# Patient Record
Sex: Female | Born: 1975 | Race: White | Hispanic: No | Marital: Married | Smoking: Never smoker
Health system: Southern US, Community
[De-identification: ages and names within clinical notes are randomized; demographics above are authoritative.]

## PROBLEM LIST (undated history)

## (undated) DIAGNOSIS — K589 Irritable bowel syndrome without diarrhea: Secondary | ICD-10-CM

## (undated) DIAGNOSIS — R51 Headache: Secondary | ICD-10-CM

## (undated) DIAGNOSIS — Z9289 Personal history of other medical treatment: Secondary | ICD-10-CM

## (undated) DIAGNOSIS — Z87442 Personal history of urinary calculi: Secondary | ICD-10-CM

## (undated) DIAGNOSIS — M545 Low back pain, unspecified: Secondary | ICD-10-CM

## (undated) DIAGNOSIS — F329 Major depressive disorder, single episode, unspecified: Secondary | ICD-10-CM

## (undated) DIAGNOSIS — IMO0002 Reserved for concepts with insufficient information to code with codable children: Secondary | ICD-10-CM

## (undated) DIAGNOSIS — G8929 Other chronic pain: Secondary | ICD-10-CM

## (undated) DIAGNOSIS — R202 Paresthesia of skin: Secondary | ICD-10-CM

## (undated) DIAGNOSIS — F988 Other specified behavioral and emotional disorders with onset usually occurring in childhood and adolescence: Secondary | ICD-10-CM

## (undated) DIAGNOSIS — E282 Polycystic ovarian syndrome: Secondary | ICD-10-CM

## (undated) DIAGNOSIS — F319 Bipolar disorder, unspecified: Secondary | ICD-10-CM

## (undated) DIAGNOSIS — B977 Papillomavirus as the cause of diseases classified elsewhere: Secondary | ICD-10-CM

## (undated) DIAGNOSIS — M51369 Other intervertebral disc degeneration, lumbar region without mention of lumbar back pain or lower extremity pain: Secondary | ICD-10-CM

## (undated) DIAGNOSIS — K219 Gastro-esophageal reflux disease without esophagitis: Secondary | ICD-10-CM

## (undated) DIAGNOSIS — M5136 Other intervertebral disc degeneration, lumbar region: Secondary | ICD-10-CM

## (undated) DIAGNOSIS — F419 Anxiety disorder, unspecified: Secondary | ICD-10-CM

## (undated) DIAGNOSIS — R519 Headache, unspecified: Secondary | ICD-10-CM

## (undated) DIAGNOSIS — R112 Nausea with vomiting, unspecified: Secondary | ICD-10-CM

## (undated) DIAGNOSIS — F32A Depression, unspecified: Secondary | ICD-10-CM

## (undated) DIAGNOSIS — M4800 Spinal stenosis, site unspecified: Secondary | ICD-10-CM

## (undated) DIAGNOSIS — M1990 Unspecified osteoarthritis, unspecified site: Secondary | ICD-10-CM

## (undated) DIAGNOSIS — I499 Cardiac arrhythmia, unspecified: Secondary | ICD-10-CM

## (undated) DIAGNOSIS — R200 Anesthesia of skin: Secondary | ICD-10-CM

## (undated) DIAGNOSIS — M48061 Spinal stenosis, lumbar region without neurogenic claudication: Secondary | ICD-10-CM

## (undated) DIAGNOSIS — Z9889 Other specified postprocedural states: Secondary | ICD-10-CM

## (undated) HISTORY — PX: COLPOSCOPY: SHX161

## (undated) HISTORY — DX: Papillomavirus as the cause of diseases classified elsewhere: B97.7

## (undated) HISTORY — PX: GANGLION CYST EXCISION: SHX1691

## (undated) HISTORY — DX: Other intervertebral disc degeneration, lumbar region without mention of lumbar back pain or lower extremity pain: M51.369

## (undated) HISTORY — DX: Other intervertebral disc degeneration, lumbar region: M51.36

## (undated) HISTORY — DX: Reserved for concepts with insufficient information to code with codable children: IMO0002

## (undated) HISTORY — DX: Other specified behavioral and emotional disorders with onset usually occurring in childhood and adolescence: F98.8

## (undated) HISTORY — DX: Irritable bowel syndrome, unspecified: K58.9

## (undated) HISTORY — PX: BACK SURGERY: SHX140

## (undated) HISTORY — DX: Polycystic ovarian syndrome: E28.2

## (undated) HISTORY — DX: Spinal stenosis, site unspecified: M48.00

## (undated) HISTORY — PX: KNEE SURGERY: SHX244

---
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
## 1999-06-12 ENCOUNTER — Other Ambulatory Visit: Admission: RE | Admit: 1999-06-12 | Discharge: 1999-06-12 | Payer: Self-pay | Admitting: Gynecology

## 2000-03-03 ENCOUNTER — Inpatient Hospital Stay (HOSPITAL_COMMUNITY): Admission: AD | Admit: 2000-03-03 | Discharge: 2000-03-03 | Payer: Self-pay | Admitting: Gynecology

## 2000-03-11 ENCOUNTER — Inpatient Hospital Stay (HOSPITAL_COMMUNITY): Admission: AD | Admit: 2000-03-11 | Discharge: 2000-03-11 | Payer: Self-pay | Admitting: *Deleted

## 2000-03-15 ENCOUNTER — Inpatient Hospital Stay (HOSPITAL_COMMUNITY): Admission: AD | Admit: 2000-03-15 | Discharge: 2000-03-18 | Payer: Self-pay | Admitting: Gynecology

## 2001-05-01 ENCOUNTER — Other Ambulatory Visit: Admission: RE | Admit: 2001-05-01 | Discharge: 2001-05-01 | Payer: Self-pay | Admitting: Gynecology

## 2001-07-31 ENCOUNTER — Other Ambulatory Visit: Admission: RE | Admit: 2001-07-31 | Discharge: 2001-07-31 | Payer: Self-pay | Admitting: Obstetrics and Gynecology

## 2002-04-23 ENCOUNTER — Encounter: Payer: Self-pay | Admitting: Emergency Medicine

## 2002-04-23 ENCOUNTER — Emergency Department (HOSPITAL_COMMUNITY): Admission: EM | Admit: 2002-04-23 | Discharge: 2002-04-23 | Payer: Self-pay | Admitting: Emergency Medicine

## 2002-05-17 ENCOUNTER — Other Ambulatory Visit: Admission: RE | Admit: 2002-05-17 | Discharge: 2002-05-17 | Payer: Self-pay | Admitting: *Deleted

## 2003-05-29 ENCOUNTER — Other Ambulatory Visit: Admission: RE | Admit: 2003-05-29 | Discharge: 2003-05-29 | Payer: Self-pay | Admitting: Gynecology

## 2004-06-08 ENCOUNTER — Other Ambulatory Visit: Admission: RE | Admit: 2004-06-08 | Discharge: 2004-06-08 | Payer: Self-pay | Admitting: Gynecology

## 2005-06-23 ENCOUNTER — Emergency Department (HOSPITAL_COMMUNITY): Admission: EM | Admit: 2005-06-23 | Discharge: 2005-06-23 | Payer: Self-pay | Admitting: Family Medicine

## 2005-08-31 ENCOUNTER — Other Ambulatory Visit: Admission: RE | Admit: 2005-08-31 | Discharge: 2005-08-31 | Payer: Self-pay | Admitting: Gynecology

## 2006-12-14 ENCOUNTER — Other Ambulatory Visit: Admission: RE | Admit: 2006-12-14 | Discharge: 2006-12-14 | Payer: Self-pay | Admitting: Gynecology

## 2007-12-18 ENCOUNTER — Other Ambulatory Visit: Admission: RE | Admit: 2007-12-18 | Discharge: 2007-12-18 | Payer: Self-pay | Admitting: Obstetrics and Gynecology

## 2009-03-31 HISTORY — PX: CARPAL TUNNEL RELEASE: SHX101

## 2009-04-23 ENCOUNTER — Ambulatory Visit: Payer: Self-pay | Admitting: Gynecology

## 2009-04-23 ENCOUNTER — Other Ambulatory Visit: Admission: RE | Admit: 2009-04-23 | Discharge: 2009-04-23 | Payer: Self-pay | Admitting: Gynecology

## 2009-11-15 ENCOUNTER — Emergency Department (HOSPITAL_COMMUNITY): Admission: EM | Admit: 2009-11-15 | Discharge: 2009-11-15 | Payer: Self-pay | Admitting: Family Medicine

## 2010-04-29 ENCOUNTER — Ambulatory Visit: Payer: Self-pay | Admitting: Gynecology

## 2010-04-29 ENCOUNTER — Other Ambulatory Visit
Admission: RE | Admit: 2010-04-29 | Discharge: 2010-04-29 | Payer: Self-pay | Source: Home / Self Care | Admitting: Gynecology

## 2010-05-21 ENCOUNTER — Ambulatory Visit: Payer: Self-pay | Admitting: Gynecology
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
## 2010-10-16 NOTE — Discharge Summary (Signed)
[facility]  Patient:    Rachel French, Rachel French                      MRN: [id number] Adm. Date:  [id number] Disc. Date: [id number] Attending:  Tonye Royalty Dictator:   Antony Contras, [facility]                           Discharge Summary  DISCHARGE DIAGNOSES:          1. Intrauterine pregnancy at 39 weeks.                               2. History of positive group B streptococcus.  PROCEDURES:                   1. Normal spontaneous vaginal delivery over                                  intact perineum.                               2. Repair of first degree laceration.  HISTORY OF PRESENT ILLNESS:   The patient is a 35 year old, gravida 2, para 0-0-1-0 with an LMP of June 16, 1999, and [facility] of March 22, 2000. Prenatal risk factors include a history of oligohydramnios early in the pregnancy at 22 weeks, which was managed conservatively with rest and followed serially by ultrasounds, and this did resolve.  She also has a previous history of LEEP procedure.  PRENATAL LABORATORIES:        Blood type O+.  Antibody screen negative.  RPR, HBSAG, and HIV nonreactive.  MSAFP normal.  GBS positive.  HOSPITAL COURSE AND TREATMENT:                    The patient was admitted on March 15, 2000, with gross rupture of the membranes and no labor at that time.  On cervical exam, she was 1 cm, 80%, and -3 station.  She was admitted and given IV antibiotics to cover the group B streptococcus.  Also, Pitocin was initiated. She did progress to complete dilatation and delivered of an Apgar 8 and 29 female infant weighing 7 pounds 2 ounces over intact perineum.  A first degree laceration was repaired.  She also did have some lower uterine segment atony which was treated with massage, Pitocin, and IM Methergine.  During her postpartum course, she remained afebrile.  She had no difficulty voiding.  Her blood pressures were in the normal range.  She did have some  labile hypertension during the pregnancy.  The initial postpartum blood pressure was 158/90, but this did resolve.  She was able to be discharged on her second postpartum day in satisfactory condition.  The CBC showed a hematocrit of 27.8, hemoglobin 9.4, WBC 27.9, and platelets 227.  DISCHARGE MEDICATIONS:        She is to continue on her prenatal vitamins and iron.  Motrin and Tylox for pain.  FOLLOW-UP:                    Follow up in six weeks. DD:  04/08/00 TD:  04/08/00 Job: [id number]

## 2011-01-07 ENCOUNTER — Telehealth: Payer: Self-pay | Admitting: *Deleted

## 2011-01-07 NOTE — Telephone Encounter (Signed)
PT CALLED C/O EXTRA HAIR GROWTH & HORMONES OUT OF WACK, DECREASE SEX DRIVE. PT HAS PCOS AND SHE STATES THAT IT HAS GOTTEN WORSE WITH  BCP JUNEL .PT STATES THAT SHE HAS TO WAX ALL THE TIME AND HAS GROWN FACIAL HAIR. OFFICE VISIT OFFERED TO PT, SHE STATES SHE WONT HAVE MONEY UNTIL September ONCE SCHOOL STARTS BACK. SHE STATES THERE IS A PILL TO HELP WITH EXCESSIVE HAIR GROWTH THAT DR Eda Paschal TOLD HER ABOUT? PLEASE ADVISE.

## 2011-01-07 NOTE — Telephone Encounter (Signed)
[name] or [name] or the pills that have drospirenone a different type of progesterone that may have some benefit as far as patient's with PCOS. Also it is the pills that is on the television commercials talking about the increased risk of stroke heart attack DVT. He appears to have a 2-3 times higher risk over conventional pills. Having said that the risk is still low and otherwise healthy patient does not smoke cigarettes and is not being followed for medical issues such as hypertension and diabetes. Patient was on the as before she wants to restart this on to that as long as she understands the above and accepts the risks. Otherwise from a hair growth standpoint she may want to consider seeing a dermatologist to discuss options for medical treatment.

## 2011-01-08 ENCOUNTER — Telehealth: Payer: Self-pay | Admitting: Gynecology

## 2011-01-08 DIAGNOSIS — Z309 Encounter for contraceptive management, unspecified: Secondary | ICD-10-CM

## 2011-01-08 DIAGNOSIS — L680 Hirsutism: Secondary | ICD-10-CM

## 2011-01-08 MED ORDER — DROSPIRENONE-ETHINYL ESTRADIOL 3-0.02 MG PO TABS: 1.0000 | ORAL_TABLET | Freq: Every day | ORAL | Status: DC

## 2011-01-08 NOTE — Telephone Encounter (Signed)
PT INFORMED WITH THE BELOW NOTE. PT STATES SHE WILL SEE THE OUTCOME OF HER LAB RESULTS.

## 2011-01-08 NOTE — Telephone Encounter (Signed)
L/M FOR PT TO CALL.

## 2011-01-08 NOTE — Telephone Encounter (Signed)
Tell patient I want to check hair growth hormone levels just to make sure they are normal.  I ordered testosterone, DHEAS & 17 oh progesterone level.

## 2011-01-08 NOTE — Telephone Encounter (Signed)
Telephone call to reviewed medication request. Reviewed Rachel French has slight higher risk for blood clots and strokes. She states her skin has been terrible since she's been on the Loestrin 1/20. She saw dermatologist. Requests to return to the [name] will call in.

## 2011-01-08 NOTE — Telephone Encounter (Signed)
PT INFORMED WITH THE BELOW NOTE. PT WILL MAKE APPOINTMENT TO COME AND GET LABS DRAWN.

## 2011-01-08 NOTE — Telephone Encounter (Signed)
PT CALLING WANTING TO SWITCH BACK TO [name] BCP. PT STATES THAT HER NEW BCP LOESTRIN 1/20 CAUSES HER TO BREAK OUT. PLEASE ADVISE.

## 2011-01-14 ENCOUNTER — Other Ambulatory Visit: Payer: BC Managed Care – PPO | Admitting: *Deleted

## 2011-01-14 DIAGNOSIS — L680 Hirsutism: Secondary | ICD-10-CM

## 2011-01-15 LAB — TESTOSTERONE: Testosterone: 26.92 ng/dL (ref 10–70)

## 2011-06-10 ENCOUNTER — Encounter: Payer: Self-pay | Admitting: *Deleted

## 2011-06-10 DIAGNOSIS — O3440 Maternal care for other abnormalities of cervix, unspecified trimester: Secondary | ICD-10-CM | POA: Insufficient documentation

## 2011-06-10 DIAGNOSIS — F319 Bipolar disorder, unspecified: Secondary | ICD-10-CM | POA: Insufficient documentation

## 2011-06-10 DIAGNOSIS — Z9889 Other specified postprocedural states: Secondary | ICD-10-CM | POA: Insufficient documentation

## 2011-06-10 DIAGNOSIS — K5190 Ulcerative colitis, unspecified, without complications: Secondary | ICD-10-CM | POA: Insufficient documentation

## 2011-06-10 DIAGNOSIS — IMO0002 Reserved for concepts with insufficient information to code with codable children: Secondary | ICD-10-CM | POA: Insufficient documentation

## 2011-06-10 DIAGNOSIS — K589 Irritable bowel syndrome without diarrhea: Secondary | ICD-10-CM | POA: Insufficient documentation

## 2011-06-10 DIAGNOSIS — E282 Polycystic ovarian syndrome: Secondary | ICD-10-CM | POA: Insufficient documentation

## 2011-06-16 ENCOUNTER — Encounter: Payer: Self-pay | Admitting: Gynecology

## 2011-06-16 ENCOUNTER — Other Ambulatory Visit (HOSPITAL_COMMUNITY)
Admission: RE | Admit: 2011-06-16 | Discharge: 2011-06-16 | Disposition: A | Discharge status: Home / Self Care | Payer: BC Managed Care – PPO | Source: Ambulatory Visit | Attending: Gynecology | Admitting: Gynecology

## 2011-06-16 ENCOUNTER — Ambulatory Visit (INDEPENDENT_AMBULATORY_CARE_PROVIDER_SITE_OTHER): Payer: BC Managed Care – PPO | Admitting: Gynecology

## 2011-06-16 VITALS — BP 114/76 | Ht 62.0 in | Wt 169.0 lb

## 2011-06-16 DIAGNOSIS — R635 Abnormal weight gain: Secondary | ICD-10-CM

## 2011-06-16 DIAGNOSIS — Z309 Encounter for contraceptive management, unspecified: Secondary | ICD-10-CM

## 2011-06-16 DIAGNOSIS — R82998 Other abnormal findings in urine: Secondary | ICD-10-CM

## 2011-06-16 DIAGNOSIS — Z01419 Encounter for gynecological examination (general) (routine) without abnormal findings: Secondary | ICD-10-CM

## 2011-06-16 DIAGNOSIS — R8271 Bacteriuria: Secondary | ICD-10-CM

## 2011-06-16 DIAGNOSIS — Z131 Encounter for screening for diabetes mellitus: Secondary | ICD-10-CM

## 2011-06-16 DIAGNOSIS — Z13220 Encounter for screening for lipoid disorders: Secondary | ICD-10-CM

## 2011-06-16 LAB — URINALYSIS W MICROSCOPIC + REFLEX CULTURE
Bilirubin Urine: NEGATIVE
Leukocytes, UA: NEGATIVE
Protein, ur: NEGATIVE mg/dL
Urobilinogen, UA: 0.2 mg/dL (ref 0.0–1.0)

## 2011-06-16 LAB — LIPID PANEL
Total CHOL/HDL Ratio: 3 Ratio
VLDL: 28 mg/dL (ref 0–40)

## 2011-06-16 MED ORDER — DROSPIRENONE-ETHINYL ESTRADIOL 3-0.02 MG PO TABS: 1.0000 | ORAL_TABLET | Freq: Every day | ORAL | Status: DC

## 2011-06-16 NOTE — Patient Instructions (Addendum)
  Follow up for Pap smear results. If normal then repeat Pap smear in 6 months.

## 2011-06-16 NOTE — Progress Notes (Signed)
Rachel French December 09, 1975 [id number]        36 y.o.  for annual exam.  Overall doing well. She is having difficulty losing weight. She has a history of PCO. She had been on metformin in the past but can't tolerate it due to GI side effects with her ulcerative colitis. She's having regular periods on her oral contraceptives.  History of low-grade dysplasia one year ago did not follow up for her six-month Pap smear.  Past medical history,surgical history, medications, allergies, family history and social history were all reviewed and documented in the EPIC chart. ROS:  Was performed and pertinent positives and negatives are included in the history.  Exam: chaperone present Filed Vitals:   06/16/11 1514  BP: 114/76   General appearance  Normal Skin grossly normal Head/Neck normal with no cervical or supraclavicular adenopathy thyroid normal Lungs  clear Cardiac RR, without RMG Abdominal  soft, nontender, without masses, organomegaly or hernia Breasts  examined lying and sitting without masses, retractions, discharge or axillary adenopathy. Pelvic  Ext/BUS/vagina  normal   Cervix  normal  Pap done  Uterus  anteverted, normal size, shape and contour, midline and mobile nontender   Adnexa  Without masses or tenderness    Anus and perineum  normal   Rectovaginal  normal sphincter tone without palpated masses or tenderness.    Assessment/Plan:  36 y.o. female for annual exam.    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 Patient did not follow up for her 6 month Pap smears I recommended. Pap was done today. If Pap normal recommended follow up in 6 months we have several normals to avoid false negative results. The patient understands the importance of follow up. Otherwise then we'll triage based on results. 2. PCO. Patient's having difficulty losing weight. I reviewed exercise and diet issues and recommended several programs to include Weight Watchers. Do not think  medication at this point helpful particularly with metformin or other diabetic take medication as she has been known to have normal glucoses. I went ahead and checked a glucose and hemoglobin A1c today as well as a TSH. 3. Oral contraceptives. She is on [name] equivalent and wants to continue. I reviewed the risks benefits to include increased risk of stroke heart attack DVT. She has never smoked not being followed for any vascular issues accepts the risks and wants to continue and I refilled her times a year. 4. Breast health. SBE monthly review. She turns 36 this year and I discussed screening mammogram recommendation is to take 35 and 40. She has no strong family history and prefers to wait closer to 40. 5. Health maintenance.  Will check baseline CBC lipid profile along with her glucose hemoglobin A1c, TSH and urinalysis.    Dara Lords MD, 4:44 PM 06/16/2011

## 2011-06-17 ENCOUNTER — Other Ambulatory Visit: Payer: Self-pay | Admitting: *Deleted

## 2011-06-17 DIAGNOSIS — R8271 Bacteriuria: Secondary | ICD-10-CM

## 2011-06-17 LAB — CBC WITH DIFFERENTIAL/PLATELET
Basophils Relative: 0 % (ref 0–1)
Eosinophils Absolute: 0.8 10*3/uL — ABNORMAL HIGH (ref 0.0–0.7)
HCT: 39 % (ref 36.0–46.0)
Hemoglobin: 12.5 g/dL (ref 12.0–15.0)
MCH: 30 pg (ref 26.0–34.0)
MCHC: 32.1 g/dL (ref 30.0–36.0)
Monocytes Absolute: 1 10*3/uL (ref 0.1–1.0)
Monocytes Relative: 10 % (ref 3–12)

## 2011-06-17 LAB — HEMOGLOBIN A1C
Hgb A1c MFr Bld: 5.6 % (ref <5.7)
Mean Plasma Glucose: 114 mg/dL (ref <117)

## 2011-06-17 LAB — TSH: TSH: 1.454 u[IU]/mL (ref 0.350–4.500)

## 2011-06-17 NOTE — Progress Notes (Signed)
Addended by: Rushie Goltz on: 06/17/2011 10:25 AM   Modules accepted: Orders

## 2011-06-19 LAB — URINE CULTURE: Colony Count: 3000

## 2011-07-08 ENCOUNTER — Telehealth: Payer: Self-pay | Admitting: *Deleted

## 2011-07-08 NOTE — Telephone Encounter (Signed)
Pt informed of normal recent pap results. 

## 2011-12-21 ENCOUNTER — Other Ambulatory Visit (HOSPITAL_COMMUNITY)
Admission: RE | Admit: 2011-12-21 | Discharge: 2011-12-21 | Disposition: A | Discharge status: Home / Self Care | Payer: BC Managed Care – PPO | Source: Ambulatory Visit | Attending: Gynecology | Admitting: Gynecology

## 2011-12-21 ENCOUNTER — Ambulatory Visit (INDEPENDENT_AMBULATORY_CARE_PROVIDER_SITE_OTHER): Payer: BC Managed Care – PPO | Admitting: Gynecology

## 2011-12-21 ENCOUNTER — Encounter: Payer: Self-pay | Admitting: Gynecology

## 2011-12-21 DIAGNOSIS — IMO0002 Reserved for concepts with insufficient information to code with codable children: Secondary | ICD-10-CM

## 2011-12-21 DIAGNOSIS — R87810 Cervical high risk human papillomavirus (HPV) DNA test positive: Secondary | ICD-10-CM | POA: Insufficient documentation

## 2011-12-21 DIAGNOSIS — R87612 Low grade squamous intraepithelial lesion on cytologic smear of cervix (LGSIL): Secondary | ICD-10-CM

## 2011-12-21 DIAGNOSIS — Z01419 Encounter for gynecological examination (general) (routine) without abnormal findings: Secondary | ICD-10-CM | POA: Insufficient documentation

## 2011-12-21 NOTE — Patient Instructions (Signed)
Follow up for annual exam in 6 months. Office will contact you if there is a problem with your Pap smear.

## 2011-12-21 NOTE — Progress Notes (Signed)
Patient presents for Pap smear. She has a history of low-grade SIL  18 months ago.  Pap smear January 2013 was negative. Here for repack.  Exam with Selena Batten assistant Pelvic: External BUS vagina normal. Cervix normal Pap/HPV. Uterus normal size midline mobile retroverted. Adnexa without masses or tenderness.  Assessment and plan history of low-grade SIL. Pap/HPV done. Assuming normal then we'll follow up at her annual in 6 months.

## 2011-12-24 ENCOUNTER — Encounter: Payer: Self-pay | Admitting: Gynecology

## 2012-07-01 DIAGNOSIS — B977 Papillomavirus as the cause of diseases classified elsewhere: Secondary | ICD-10-CM

## 2012-07-01 DIAGNOSIS — IMO0002 Reserved for concepts with insufficient information to code with codable children: Secondary | ICD-10-CM

## 2012-07-01 HISTORY — DX: Reserved for concepts with insufficient information to code with codable children: IMO0002

## 2012-07-01 HISTORY — DX: Papillomavirus as the cause of diseases classified elsewhere: B97.7

## 2012-07-09 ENCOUNTER — Other Ambulatory Visit: Payer: Self-pay | Admitting: Women's Health

## 2012-07-10 ENCOUNTER — Other Ambulatory Visit: Payer: Self-pay

## 2012-07-10 MED ORDER — DROSPIRENONE-ETHINYL ESTRADIOL 3-0.02 MG PO TABS: ORAL_TABLET | ORAL | Status: DC

## 2012-07-10 NOTE — Telephone Encounter (Signed)
Appointment desk will call her to schedule CE.

## 2012-07-10 NOTE — Telephone Encounter (Signed)
Will be called to schedule her annual

## 2012-07-19 ENCOUNTER — Ambulatory Visit (INDEPENDENT_AMBULATORY_CARE_PROVIDER_SITE_OTHER): Payer: BC Managed Care – PPO | Admitting: Gynecology

## 2012-07-19 ENCOUNTER — Other Ambulatory Visit (HOSPITAL_COMMUNITY)
Admission: RE | Admit: 2012-07-19 | Discharge: 2012-07-19 | Disposition: A | Discharge status: Home / Self Care | Payer: BC Managed Care – PPO | Source: Ambulatory Visit | Attending: Gynecology | Admitting: Gynecology

## 2012-07-19 ENCOUNTER — Encounter: Payer: Self-pay | Admitting: Gynecology

## 2012-07-19 VITALS — BP 124/76 | Ht 62.0 in | Wt 171.0 lb

## 2012-07-19 DIAGNOSIS — R87810 Cervical high risk human papillomavirus (HPV) DNA test positive: Secondary | ICD-10-CM | POA: Insufficient documentation

## 2012-07-19 DIAGNOSIS — Z01419 Encounter for gynecological examination (general) (routine) without abnormal findings: Secondary | ICD-10-CM

## 2012-07-19 DIAGNOSIS — Z1151 Encounter for screening for human papillomavirus (HPV): Secondary | ICD-10-CM | POA: Insufficient documentation

## 2012-07-19 LAB — CBC WITH DIFFERENTIAL/PLATELET
Basophils Relative: 0 % (ref 0–1)
Eosinophils Absolute: 0.9 10*3/uL — ABNORMAL HIGH (ref 0.0–0.7)
Eosinophils Relative: 8 % — ABNORMAL HIGH (ref 0–5)
HCT: 35.8 % — ABNORMAL LOW (ref 36.0–46.0)
Hemoglobin: 12.1 g/dL (ref 12.0–15.0)
MCH: 30.7 pg (ref 26.0–34.0)
MCHC: 33.8 g/dL (ref 30.0–36.0)
Monocytes Absolute: 1.4 10*3/uL — ABNORMAL HIGH (ref 0.1–1.0)
Monocytes Relative: 12 % (ref 3–12)

## 2012-07-19 LAB — LIPID PANEL
LDL Cholesterol: 96 mg/dL (ref 0–99)
VLDL: 25 mg/dL (ref 0–40)

## 2012-07-19 LAB — COMPREHENSIVE METABOLIC PANEL
ALT: 9 U/L (ref 0–35)
AST: 10 U/L (ref 0–37)
Alkaline Phosphatase: 79 U/L (ref 39–117)
BUN: 15 mg/dL (ref 6–23)
Creat: 0.73 mg/dL (ref 0.50–1.10)
Potassium: 4.2 mEq/L (ref 3.5–5.3)

## 2012-07-19 MED ORDER — DROSPIRENONE-ETHINYL ESTRADIOL 3-0.02 MG PO TABS: ORAL_TABLET | ORAL | Status: DC

## 2012-07-19 MED ORDER — ACYCLOVIR 5 % EX OINT: TOPICAL_OINTMENT | CUTANEOUS | Status: DC

## 2012-07-19 NOTE — Patient Instructions (Addendum)
Follow up one year for annual exam. Follow up for lab results on my chart.

## 2012-07-19 NOTE — Progress Notes (Signed)
Rachel French 06/25/1975 [id number]        37 y.o.  G2P0011 for annual exam.  Several issues reviewed below.  Past medical history,surgical history, medications, allergies, family history and social history were all reviewed and documented in the EPIC chart. ROS:  Was performed and pertinent positives and negatives are included in the history.  Exam: Kim assistant Filed Vitals:   07/19/12 1458  BP: 124/76  Height: 5\' 2"  (1.575 m)  Weight: 171 lb (77.565 kg)   General appearance  Normal Skin grossly normal Head/Neck normal with no cervical or supraclavicular adenopathy thyroid normal Lungs  clear Cardiac RR, without RMG Abdominal  soft, nontender, without masses, organomegaly or hernia Breasts  examined lying and sitting without masses, retractions, discharge or axillary adenopathy. Pelvic  Ext/BUS/vagina  normal   Cervix  normal Pap/HPV  Uterus  anteverted, normal size, shape and contour, midline and mobile nontender   Adnexa  Without masses or tenderness    Anus and perineum  normal   Rectovaginal  normal sphincter tone without palpated masses or tenderness.    Assessment/Plan:  37 y.o. G61P0011 female for annual exam.   1. 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 Cytology was normal. Pap/HPV done today. 2. [name] equivalent BCPs. Doing well on these and wants to continue. Are reviewed again the risk of BCPs and possible increased risk with [name] of stroke heart attack DVT. Not being followed for any medical problems does not smoke and accepts the risks and I refilled her times a year. Encouraged her to consider alternatives to include IUD. 3. Mammography. Screening mammography between 35 and 40 discussed. Patient has no strong family history first wait closer to 40. SBE monthly reviewed. 4. Health maintenance. Baseline CBC comprehensive metabolic panel lipid profile urinalysis and TSH done at her request. Follow up one year, sooner as  needed.    Dara Lords MD, 3:27 PM 07/19/2012

## 2012-07-19 NOTE — Addendum Note (Signed)
Addended by: Dayna Barker on: 07/19/2012 03:33 PM   Modules accepted: Orders

## 2012-07-20 ENCOUNTER — Other Ambulatory Visit: Payer: Self-pay | Admitting: Gynecology

## 2012-07-20 DIAGNOSIS — R7989 Other specified abnormal findings of blood chemistry: Secondary | ICD-10-CM

## 2012-07-20 DIAGNOSIS — D649 Anemia, unspecified: Secondary | ICD-10-CM

## 2012-07-20 LAB — URINALYSIS W MICROSCOPIC + REFLEX CULTURE
Ketones, ur: NEGATIVE mg/dL
Nitrite: NEGATIVE
Specific Gravity, Urine: 1.023 (ref 1.005–1.030)
Urobilinogen, UA: 0.2 mg/dL (ref 0.0–1.0)

## 2012-07-22 LAB — URINE CULTURE: Colony Count: >=100000

## 2012-07-24 ENCOUNTER — Other Ambulatory Visit: Payer: Self-pay | Admitting: Gynecology

## 2012-07-24 MED ORDER — NITROFURANTOIN MONOHYD MACRO 100 MG PO CAPS: 100.0000 mg | ORAL_CAPSULE | Freq: Two times a day (BID) | ORAL | Status: DC

## 2012-07-25 ENCOUNTER — Ambulatory Visit (INDEPENDENT_AMBULATORY_CARE_PROVIDER_SITE_OTHER): Payer: BC Managed Care – PPO | Admitting: Gynecology

## 2012-07-25 ENCOUNTER — Encounter: Payer: Self-pay | Admitting: Obstetrics and Gynecology

## 2012-07-25 ENCOUNTER — Encounter: Payer: Self-pay | Admitting: Gynecology

## 2012-07-25 DIAGNOSIS — R87612 Low grade squamous intraepithelial lesion on cytologic smear of cervix (LGSIL): Secondary | ICD-10-CM

## 2012-07-25 DIAGNOSIS — R87810 Cervical high risk human papillomavirus (HPV) DNA test positive: Secondary | ICD-10-CM

## 2012-07-25 NOTE — Progress Notes (Signed)
Patient ID: Rachel French, female   DOB: 1976/03/22, 37 y.o.   MRN: [id number] History 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 Cytology was normal.  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 Patient presents for colposcopy.  Exam with chem assistant External BUS vagina normal. Cervix grossly normal. Colposcopy adequate with acetic acid cleanse with translucent to acetowhite change at 10 to 12:00. Rep. Biopsy taken. ECC performed with curette and follow up endobrush retrievable.  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 Biopsy and ECC performed. Patient will follow for results. If low grade or normal them plan repeat co-testing in one year. If otherwise then we'll triage based on results

## 2012-07-25 NOTE — Addendum Note (Signed)
Addended by: Dayna Barker on: 07/25/2012 04:05 PM   Modules accepted: Orders

## 2012-07-25 NOTE — Patient Instructions (Signed)

## 2012-07-31 ENCOUNTER — Ambulatory Visit: Payer: BC Managed Care – PPO | Admitting: Gynecology

## 2013-02-23 ENCOUNTER — Ambulatory Visit: Payer: Self-pay

## 2013-02-23 ENCOUNTER — Other Ambulatory Visit: Payer: Self-pay | Admitting: Occupational Medicine

## 2013-02-23 DIAGNOSIS — R05 Cough: Secondary | ICD-10-CM

## 2013-03-29 ENCOUNTER — Telehealth: Payer: Self-pay | Admitting: *Deleted

## 2013-03-29 MED ORDER — METFORMIN HCL 500 MG PO TABS: 500.0000 mg | ORAL_TABLET | Freq: Every day | ORAL | Status: DC

## 2013-03-29 NOTE — Telephone Encounter (Signed)
Pt has PCOS was on metformin, she would like to know if she could start back on this to help control symptoms? Pt still taking birth controls. Please advise

## 2013-03-29 NOTE — Telephone Encounter (Signed)
Pt said excessive hair growth and weight gain that is very hard to lose. Pt read about this helping with PCOS

## 2013-03-29 NOTE — Telephone Encounter (Signed)
She can start with one pill each morning for 2 weeks and then one pill twice a day. Main side effects are gastrointestinal upset to include diarrhea and nausea. Main severe risk is lactic acidosis which can cause a low muscle cramps, malaise and fatigue. I know she took this before and it did well although I do remember her having some GI upset. If she wants to go ahead and try again I put the prescription through.

## 2013-03-29 NOTE — Telephone Encounter (Signed)
Control what symptoms?

## 2013-03-30 NOTE — Telephone Encounter (Signed)
Pt informed with the below note. 

## 2013-05-01 ENCOUNTER — Encounter: Payer: Self-pay | Admitting: Family Medicine

## 2013-05-01 ENCOUNTER — Ambulatory Visit (INDEPENDENT_AMBULATORY_CARE_PROVIDER_SITE_OTHER): Payer: BC Managed Care – PPO | Admitting: Family Medicine

## 2013-05-01 MED ORDER — TOPIRAMATE 25 MG PO TABS: 25.0000 mg | ORAL_TABLET | Freq: Two times a day (BID) | ORAL | Status: DC

## 2013-05-01 NOTE — Progress Notes (Signed)
   Subjective:    Patient ID: Rachel French, female    DOB: 1976-05-27, 37 y.o.   MRN: [id number]  HPI Patient has a history of PCO S. and ulcerative colitis. She is dealing with morbid obesity. She is interested in medications to help with weight loss. She's gone to try to start exercising 30 minutes a day 5 days a week. She is also trying to restrict her caloric intake. However she's been unsuccessful to this point been able to lose weight. She has tried adipex in the past with some success. Past Medical History  Diagnosis Date  . 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 IBS (irritable bowel syndrome)   . PCOS (polycystic ovarian syndrome)   . Ulcerative colitis   . Infertility   . 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 drospirenone-ethinyl estradiol (VESTURA) 3-0.02 MG tablet TAKE ONE TABLET BY MOUTH DAILY  28 tablet  11   No current facility-administered medications on file prior to visit.   Allergies  Allergen Reactions  . Codeine   . Levaquin [Levofloxacin In D5w] Itching   History   Social History  . Marital Status: Married    Spouse Name: N/A    Number of Children: N/A  . Years of Education: N/A   Occupational History  . Not on file.   Social History Main Topics  . Smoking status: Never Smoker   . Smokeless tobacco: Never Used  . Alcohol Use: No  . Drug Use: No  . Sexual Activity: Yes    Birth Control/ Protection: Pill   Other Topics Concern  . Not on file   Social History Narrative  . No narrative on file      Review of Systems  All other systems reviewed and are negative.       Objective:   Physical Exam  Vitals reviewed. Cardiovascular: Normal rate and regular rhythm.   Pulmonary/Chest: Effort normal and breath sounds normal.          Assessment & Plan:  1. Morbid obesity She would like to try  Topamax for appetite suppressant. She is aware of the transgenic effects of Topamax. I warned her about dizziness and sedation. Start Topamax 25 mg by mouth every morning for one week and then increase to 25 mg by mouth 3 times a day thereafter recheck in one month. If she continues medication greater than 3 months I will check a CBC and CMP. - topiramate (TOPAMAX) 25 MG tablet; Take 1 tablet (25 mg total) by mouth 2 (two) times daily.  Dispense: 60 tablet; Refill: 2

## 2013-06-12 ENCOUNTER — Telehealth: Payer: Self-pay | Admitting: Family Medicine

## 2013-06-12 NOTE — Telephone Encounter (Signed)
Patient states that the Topamax is not working-  Please call [phone]

## 2013-06-13 NOTE — Telephone Encounter (Signed)
Topamax is making her too sleepy and would like to try phenetermine

## 2013-06-14 MED ORDER — PHENTERMINE HCL 37.5 MG PO CAPS: 37.5000 mg | ORAL_CAPSULE | Freq: Every day | ORAL | Status: DC

## 2013-06-14 NOTE — Telephone Encounter (Signed)
Pt aware of change script printed and ready for approval signature and pt to pick up

## 2013-06-14 NOTE — Telephone Encounter (Signed)
Okay, adipex 37.5 poqday 30 tabs with 2 refills only.  DC topamax

## 2013-06-15 ENCOUNTER — Other Ambulatory Visit: Payer: Self-pay | Admitting: *Deleted

## 2013-06-15 MED ORDER — PHENTERMINE HCL 37.5 MG PO CAPS: 37.5000 mg | ORAL_CAPSULE | Freq: Every day | ORAL | Status: DC

## 2013-07-04 ENCOUNTER — Other Ambulatory Visit: Payer: Self-pay | Admitting: Occupational Medicine

## 2013-07-04 ENCOUNTER — Ambulatory Visit: Payer: Self-pay

## 2013-07-04 DIAGNOSIS — R52 Pain, unspecified: Secondary | ICD-10-CM

## 2013-07-21 ENCOUNTER — Other Ambulatory Visit: Payer: Self-pay | Admitting: Gynecology

## 2013-08-15 ENCOUNTER — Other Ambulatory Visit (HOSPITAL_COMMUNITY)
Admission: RE | Admit: 2013-08-15 | Discharge: 2013-08-15 | Disposition: A | Discharge status: Home / Self Care | Payer: BC Managed Care – PPO | Source: Ambulatory Visit | Attending: Gynecology | Admitting: Gynecology

## 2013-08-15 ENCOUNTER — Ambulatory Visit (INDEPENDENT_AMBULATORY_CARE_PROVIDER_SITE_OTHER): Payer: BC Managed Care – PPO | Admitting: Gynecology

## 2013-08-15 ENCOUNTER — Encounter: Payer: Self-pay | Admitting: Gynecology

## 2013-08-15 VITALS — BP 120/74 | Ht 61.0 in | Wt 181.0 lb

## 2013-08-15 DIAGNOSIS — Z1151 Encounter for screening for human papillomavirus (HPV): Secondary | ICD-10-CM | POA: Insufficient documentation

## 2013-08-15 DIAGNOSIS — Z01419 Encounter for gynecological examination (general) (routine) without abnormal findings: Secondary | ICD-10-CM | POA: Insufficient documentation

## 2013-08-15 DIAGNOSIS — IMO0002 Reserved for concepts with insufficient information to code with codable children: Secondary | ICD-10-CM

## 2013-08-15 DIAGNOSIS — R6889 Other general symptoms and signs: Secondary | ICD-10-CM

## 2013-08-15 MED ORDER — DROSPIRENONE-ETHINYL ESTRADIOL 3-0.02 MG PO TABS: ORAL_TABLET | ORAL | Status: DC

## 2013-08-15 NOTE — Progress Notes (Signed)
Rachel French 10-Sep-1975 [id number]        38 y.o.  G2P0011 for annual exam.  Several issues noted below.  Past medical history,surgical history, problem list, medications, allergies, family history and social history were all reviewed and documented in the EPIC chart.  ROS:  Performed and pertinent positives and negatives are included in the history, assessment and plan .  Exam: Kim assistant Filed Vitals:   08/15/13 1501  BP: 120/74  Height: 5\' 1"  (1.549 m)  Weight: 181 lb (82.101 kg)   General appearance  Normal Skin grossly normal Head/Neck normal with no cervical or supraclavicular adenopathy thyroid normal Lungs  clear Cardiac RR, without RMG Abdominal  soft, nontender, without masses, organomegaly or hernia Breasts  examined lying and sitting without masses, retractions, discharge or axillary adenopathy. Pelvic  Ext/BUS/vagina normal  Cervix normal. Pap/HPV  Uterus anteverted, normal size, shape and contour, midline and mobile nontender   Adnexa  Without masses or tenderness    Anus and perineum  Normal   Rectovaginal  Normal sphincter tone without palpated masses or tenderness.    Assessment/Plan:  38 y.o. 762P0011 female for annual exam regular menses, oral contraceptives.   1. Oral contraceptives. Patient [name] equivalent BCPs. Doing well wants to continue. Is not being followed for vascular disease or diabetes. Never smoked. I reviewed the increased risk of blood clots to include stroke heart attack DVT with oral contraceptives particularly with drospirenone containing oral contraceptives and she is comfortable with this and wants to continue it I refilled her x1 year. 2. LGSIL.  History of LEEP 1997. 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 Cytology was normal.  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 Followup colposcopy biopsy consistent with LGSIL in the biopsy and ECC. Pap/HPV done today.  Followup for results and then will triage based upon these results. 3. Mammography.  Screening mammographic recommendations between 35 and 40 were reviewed with the patient. She has no strong family history of first await closer to 40. SBE monthly reviewed. 4. Health maintenance. Lipid profile last year normal. CBC and metabolic panel urinalysis ordered this year. Followup for biopsy results and triage otherwise followup in one year, sooner as needed.   Note: This document was prepared with digital dictation and possible smart phrase technology. Any transcriptional errors that result from this process are unintentional.   Dara LordsFONTAINE,Cate Oravec P MD, 3:41 PM 08/15/2013

## 2013-08-15 NOTE — Patient Instructions (Signed)
Followup in one year for annual exam.  You may obtain a copy of any labs that were done today by logging onto MyChart as outlined in the instructions provided with your AVS (after visit summary). The office will not call with normal lab results but certainly if there are any significant abnormalities then we will contact you.   Health Maintenance, Female A healthy lifestyle and preventative care can promote health and wellness.  Maintain regular health, dental, and eye exams.  Eat a healthy diet. Foods like vegetables, fruits, whole grains, low-fat dairy products, and lean protein foods contain the nutrients you need without too many calories. Decrease your intake of foods high in solid fats, added sugars, and salt. Get information about a proper diet from your caregiver, if necessary.  Regular physical exercise is one of the most important things you can do for your health. Most adults should get at least 150 minutes of moderate-intensity exercise (any activity that increases your heart rate and causes you to sweat) each week. In addition, most adults need muscle-strengthening exercises on 2 or more days a week.   Maintain a healthy weight. The body mass index (BMI) is a screening tool to identify possible weight problems. It provides an estimate of body fat based on height and weight. Your caregiver can help determine your BMI, and can help you achieve or maintain a healthy weight. For adults 20 years and older:  A BMI below 18.5 is considered underweight.  A BMI of 18.5 to 24.9 is normal.  A BMI of 25 to 29.9 is considered overweight.  A BMI of 30 and above is considered obese.  Maintain normal blood lipids and cholesterol by exercising and minimizing your intake of saturated fat. Eat a balanced diet with plenty of fruits and vegetables. Blood tests for lipids and cholesterol should begin at age 78 and be repeated every 5 years. If your lipid or cholesterol levels are high, you are over  50, or you are a high risk for heart disease, you may need your cholesterol levels checked more frequently.Ongoing high lipid and cholesterol levels should be treated with medicines if diet and exercise are not effective.  If you smoke, find out from your caregiver how to quit. If you do not use tobacco, do not start.  Lung cancer screening is recommended for adults aged 15 80 years who are at high risk for developing lung cancer because of a history of smoking. Yearly low-dose computed tomography (CT) is recommended for people who have at least a 30-pack-year history of smoking and are a current smoker or have quit within the past 15 years. A pack year of smoking is smoking an average of 1 pack of cigarettes a day for 1 year (for example: 1 pack a day for 30 years or 2 packs a day for 15 years). Yearly screening should continue until the smoker has stopped smoking for at least 15 years. Yearly screening should also be stopped for people who develop a health problem that would prevent them from having lung cancer treatment.  If you are pregnant, do not drink alcohol. If you are breastfeeding, be very cautious about drinking alcohol. If you are not pregnant and choose to drink alcohol, do not exceed 1 drink per day. One drink is considered to be 12 ounces (355 mL) of beer, 5 ounces (148 mL) of wine, or 1.5 ounces (44 mL) of liquor.  Avoid use of street drugs. Do not share needles with anyone. Ask for help  if you need support or instructions about stopping the use of drugs.  High blood pressure causes heart disease and increases the risk of stroke. Blood pressure should be checked at least every 1 to 2 years. Ongoing high blood pressure should be treated with medicines, if weight loss and exercise are not effective.  If you are 55 to 37 years old, ask your caregiver if you should take aspirin to prevent strokes.  Diabetes screening involves taking a blood sample to check your fasting blood sugar level.  This should be done once every 3 years, after age 45, if you are within normal weight and without risk factors for diabetes. Testing should be considered at a younger age or be carried out more frequently if you are overweight and have at least 1 risk factor for diabetes.  Breast cancer screening is essential preventative care for women. You should practice "breast self-awareness." This means understanding the normal appearance and feel of your breasts and may include breast self-examination. Any changes detected, no matter how small, should be reported to a caregiver. Women in their 20s and 30s should have a clinical breast exam (CBE) by a caregiver as part of a regular health exam every 1 to 3 years. After age 40, women should have a CBE every year. Starting at age 40, women should consider having a mammogram (breast X-ray) every year. Women who have a family history of breast cancer should talk to their caregiver about genetic screening. Women at a high risk of breast cancer should talk to their caregiver about having an MRI and a mammogram every year.  Breast cancer gene (BRCA)-related cancer risk assessment is recommended for women who have family members with BRCA-related cancers. BRCA-related cancers include breast, ovarian, tubal, and peritoneal cancers. Having family members with these cancers may be associated with an increased risk for harmful changes (mutations) in the breast cancer genes BRCA1 and BRCA2. Results of the assessment will determine the need for genetic counseling and BRCA1 and BRCA2 testing.  The Pap test is a screening test for cervical cancer. Women should have a Pap test starting at age 21. Between ages 21 and 29, Pap tests should be repeated every 2 years. Beginning at age 30, you should have a Pap test every 3 years as long as the past 3 Pap tests have been normal. If you had a hysterectomy for a problem that was not cancer or a condition that could lead to cancer, then you no  longer need Pap tests. If you are between ages 65 and 70, and you have had normal Pap tests going back 10 years, you no longer need Pap tests. If you have had past treatment for cervical cancer or a condition that could lead to cancer, you need Pap tests and screening for cancer for at least 20 years after your treatment. If Pap tests have been discontinued, risk factors (such as a new sexual partner) need to be reassessed to determine if screening should be resumed. Some women have medical problems that increase the chance of getting cervical cancer. In these cases, your caregiver may recommend more frequent screening and Pap tests.  The human papillomavirus (HPV) test is an additional test that may be used for cervical cancer screening. The HPV test looks for the virus that can cause the cell changes on the cervix. The cells collected during the Pap test can be tested for HPV. The HPV test could be used to screen women aged 30 years and older, and   should be used in women of any age who have unclear Pap test results. After the age of 30, women should have HPV testing at the same frequency as a Pap test.  Colorectal cancer can be detected and often prevented. Most routine colorectal cancer screening begins at the age of 50 and continues through age 75. However, your caregiver may recommend screening at an earlier age if you have risk factors for colon cancer. On a yearly basis, your caregiver may provide home test kits to check for hidden blood in the stool. Use of a small camera at the end of a tube, to directly examine the colon (sigmoidoscopy or colonoscopy), can detect the earliest forms of colorectal cancer. Talk to your caregiver about this at age 50, when routine screening begins. Direct examination of the colon should be repeated every 5 to 10 years through age 75, unless early forms of pre-cancerous polyps or small growths are found.  Hepatitis C blood testing is recommended for all people born from  1945 through 1965 and any individual with known risks for hepatitis C.  Practice safe sex. Use condoms and avoid high-risk sexual practices to reduce the spread of sexually transmitted infections (STIs). Sexually active women aged 25 and younger should be checked for Chlamydia, which is a common sexually transmitted infection. Older women with new or multiple partners should also be tested for Chlamydia. Testing for other STIs is recommended if you are sexually active and at increased risk.  Osteoporosis is a disease in which the bones lose minerals and strength with aging. This can result in serious bone fractures. The risk of osteoporosis can be identified using a bone density scan. Women ages 65 and over and women at risk for fractures or osteoporosis should discuss screening with their caregivers. Ask your caregiver whether you should be taking a calcium supplement or vitamin D to reduce the rate of osteoporosis.  Menopause can be associated with physical symptoms and risks. Hormone replacement therapy is available to decrease symptoms and risks. You should talk to your caregiver about whether hormone replacement therapy is right for you.  Use sunscreen. Apply sunscreen liberally and repeatedly throughout the day. You should seek shade when your shadow is shorter than you. Protect yourself by wearing long sleeves, pants, a wide-brimmed hat, and sunglasses year round, whenever you are outdoors.  Notify your caregiver of new moles or changes in moles, especially if there is a change in shape or color. Also notify your caregiver if a mole is larger than the size of a pencil eraser.  Stay current with your immunizations. Document Released: 11/30/2010 Document Revised: 09/11/2012 Document Reviewed: 11/30/2010 [employer] Patient Information 2014 [employer].   

## 2013-08-16 LAB — URINALYSIS W MICROSCOPIC + REFLEX CULTURE
BILIRUBIN URINE: NEGATIVE
CRYSTALS: NONE SEEN
Casts: NONE SEEN
Glucose, UA: NEGATIVE mg/dL
Ketones, ur: NEGATIVE mg/dL
LEUKOCYTES UA: NEGATIVE
Nitrite: NEGATIVE
Protein, ur: NEGATIVE mg/dL
Urobilinogen, UA: 0.2 mg/dL (ref 0.0–1.0)
pH: 5.5 (ref 5.0–8.0)

## 2013-08-16 LAB — CBC WITH DIFFERENTIAL/PLATELET
BASOS ABS: 0.1 10*3/uL (ref 0.0–0.1)
Basophils Relative: 1 % (ref 0–1)
Eosinophils Absolute: 0.4 10*3/uL (ref 0.0–0.7)
Eosinophils Relative: 4 % (ref 0–5)
HCT: 39.5 % (ref 36.0–46.0)
Hemoglobin: 13.1 g/dL (ref 12.0–15.0)
LYMPHS PCT: 37 % (ref 12–46)
Lymphs Abs: 4.1 10*3/uL — ABNORMAL HIGH (ref 0.7–4.0)
MCH: 30.9 pg (ref 26.0–34.0)
MCHC: 33.2 g/dL (ref 30.0–36.0)
MCV: 93.2 fL (ref 78.0–100.0)
MONO ABS: 1.3 10*3/uL — AB (ref 0.1–1.0)
Monocytes Relative: 12 % (ref 3–12)
NEUTROS ABS: 5.1 10*3/uL (ref 1.7–7.7)
NEUTROS PCT: 46 % (ref 43–77)
Platelets: 421 10*3/uL — ABNORMAL HIGH (ref 150–400)
RBC: 4.24 MIL/uL (ref 3.87–5.11)
RDW: 14.4 % (ref 11.5–15.5)
WBC: 11 10*3/uL — AB (ref 4.0–10.5)

## 2013-08-16 LAB — COMPREHENSIVE METABOLIC PANEL
ALT: 12 U/L (ref 0–35)
AST: 11 U/L (ref 0–37)
Albumin: 4 g/dL (ref 3.5–5.2)
Alkaline Phosphatase: 67 U/L (ref 39–117)
BUN: 12 mg/dL (ref 6–23)
CALCIUM: 9.1 mg/dL (ref 8.4–10.5)
CHLORIDE: 105 meq/L (ref 96–112)
CO2: 26 meq/L (ref 19–32)
Creat: 0.7 mg/dL (ref 0.50–1.10)
Glucose, Bld: 82 mg/dL (ref 70–99)
Potassium: 4.4 mEq/L (ref 3.5–5.3)
Sodium: 138 mEq/L (ref 135–145)
Total Bilirubin: 0.2 mg/dL (ref 0.2–1.2)
Total Protein: 7.3 g/dL (ref 6.0–8.3)

## 2013-08-17 ENCOUNTER — Other Ambulatory Visit: Payer: Self-pay | Admitting: *Deleted

## 2013-08-17 DIAGNOSIS — Z01419 Encounter for gynecological examination (general) (routine) without abnormal findings: Secondary | ICD-10-CM

## 2013-08-18 LAB — URINE CULTURE: Colony Count: 30000

## 2013-09-19 ENCOUNTER — Other Ambulatory Visit: Payer: BC Managed Care – PPO

## 2013-09-19 DIAGNOSIS — Z01419 Encounter for gynecological examination (general) (routine) without abnormal findings: Secondary | ICD-10-CM

## 2013-09-20 LAB — URINALYSIS W MICROSCOPIC + REFLEX CULTURE
Bacteria, UA: NONE SEEN
Bilirubin Urine: NEGATIVE
CASTS: NONE SEEN
Crystals: NONE SEEN
GLUCOSE, UA: NEGATIVE mg/dL
Hgb urine dipstick: NEGATIVE
Ketones, ur: NEGATIVE mg/dL
Leukocytes, UA: NEGATIVE
NITRITE: NEGATIVE
Protein, ur: NEGATIVE mg/dL
SPECIFIC GRAVITY, URINE: 1.02 (ref 1.005–1.030)
SQUAMOUS EPITHELIAL / LPF: NONE SEEN
Urobilinogen, UA: 0.2 mg/dL (ref 0.0–1.0)
pH: 7 (ref 5.0–8.0)

## 2013-10-01 ENCOUNTER — Ambulatory Visit (INDEPENDENT_AMBULATORY_CARE_PROVIDER_SITE_OTHER): Payer: BC Managed Care – PPO | Admitting: Physician Assistant

## 2013-10-01 ENCOUNTER — Encounter: Payer: Self-pay | Admitting: Physician Assistant

## 2013-10-01 ENCOUNTER — Encounter: Payer: Self-pay | Admitting: Family Medicine

## 2013-10-01 VITALS — BP 112/62 | HR 82 | Temp 98.2°F | Resp 16 | Ht 62.0 in | Wt 178.0 lb

## 2013-10-01 DIAGNOSIS — K2970 Gastritis, unspecified, without bleeding: Secondary | ICD-10-CM

## 2013-10-01 DIAGNOSIS — T148 Other injury of unspecified body region: Secondary | ICD-10-CM

## 2013-10-01 DIAGNOSIS — K2990 Gastroduodenitis, unspecified, without bleeding: Secondary | ICD-10-CM

## 2013-10-01 DIAGNOSIS — W57XXXA Bitten or stung by nonvenomous insect and other nonvenomous arthropods, initial encounter: Secondary | ICD-10-CM

## 2013-10-01 MED ORDER — PROMETHAZINE HCL 25 MG PO TABS: 25.0000 mg | ORAL_TABLET | Freq: Three times a day (TID) | ORAL | Status: DC | PRN

## 2013-10-01 NOTE — Progress Notes (Signed)
Patient ID: Rachel French MRN: [id number], DOB: 12-12-1975, 38 y.o. Date of Encounter: 10/01/2013, 2:23 PM    Chief Complaint:  Chief Complaint  Patient presents with  . insect bite from tick    X Friday,since then feeling nauseaous, tired and vomiting last pm around 9pm, just feels "blah"     HPI: 38 y.o. year old white female says that she felt a tick on her left back just below her bra strap on Friday 09/28/13. She had her husband come and remove the tick. On Saturday, the following day, she just felt "blah ". On Sunday 09/30/13 she slept for 3 hours. That night she vomited. Again this morning she vomited one time earlier this morning and once again just prior to coming here and it is now 2:00 PM. She's had no  abdominal pain. She has seen no rashes. Has had no headache. Has had no myalgias. Has had no fevers.     Home Meds: See attached medication section for any medications that were entered at today's visit. The computer does not put those onto this list.The following list is a list of meds entered prior to today's visit.   Current Outpatient Prescriptions on File Prior to Visit  Medication Sig Dispense Refill  . drospirenone-ethinyl estradiol (VESTURA) 3-0.02 MG tablet TAKE 1 TABLET BY MOUTH EVERY DAY  28 tablet  11  . Olopatadine HCl (PATADAY) 0.2 % SOLN Apply to eye.       No current facility-administered medications on file prior to visit.    Allergies:  Allergies  Allergen Reactions  . Codeine   . Levaquin [Levofloxacin In D5w] Itching      Review of Systems: See HPI for pertinent ROS. All other ROS negative.    Physical Exam: Blood pressure 112/62, pulse 82, temperature 98.2 F (36.8 C), temperature source Oral, resp. rate 16, height 5\' 2"  (1.575 m), weight 178 lb (80.74 kg)., Body mass index is 32.55 kg/(m^2). General: WNWD WF.  Appears in no acute distress. Neck: Supple. No thyromegaly. No lymphadenopathy. Lungs: Clear bilaterally to auscultation without  wheezes, rales, or rhonchi. Breathing is unlabored. Heart: Regular rhythm. No murmurs, rubs, or gallops. Abdomen: Soft, non-tender, non-distended with normoactive bowel sounds. No hepatomegaly. No rebound/guarding. No obvious abdominal masses. Msk:  Strength and tone normal for age. Extremities/Skin: Warm and dry. No rashes. Site of tic bite is seen at left back, just below bra strap. There is skin colored papule, slightly raised, and approx 0.5 cm diameter. No erythema. No other rash on her back. Neuro: Alert and oriented X 3. Moves all extremities spontaneously. Gait is normal. CNII-XII grossly in tact. Psych:  Responds to questions appropriately with a normal affect.     ASSESSMENT AND PLAN:  38 y.o. year old female with  1. Tick bite I think that the vomiting she is experiencing is a concomitant viral gastritis. However, I told her that if this vomiting worsens or if she develops any fever or develops any additional symptoms including fever, headache, myalgias, rash--then f/u immediately.   2. Viral gastritis Give her some Phenergan to use as needed. Cautioned her that this may cause drowsiness. Do not take prior to driving or working. If he develops any fever or her symptoms as listed above and follow up immediately. Also if vomiting has not resolved in 48 hours then  followup. - promethazine (PHENERGAN) 25 MG tablet; Take 1 tablet (25 mg total) by mouth every 8 (eight) hours as needed for nausea or vomiting.  Dispense: 20 tablet; Refill: 0  Note Given to be out of work for today and tomorrow. If vomiting does not resolve tomorrow then call and we will keep her out of work further.  Murray HodgkinsSigned, Mary Beth Marine City[place], [place], [facility] 10/01/2013 2:23 PM

## 2013-12-07 ENCOUNTER — Ambulatory Visit (INDEPENDENT_AMBULATORY_CARE_PROVIDER_SITE_OTHER): Payer: BC Managed Care – PPO | Admitting: Family Medicine

## 2013-12-07 ENCOUNTER — Encounter: Payer: Self-pay | Admitting: Family Medicine

## 2013-12-07 VITALS — BP 128/76 | HR 64 | Temp 98.7°F | Resp 14 | Ht 62.0 in | Wt 184.0 lb

## 2013-12-07 DIAGNOSIS — L237 Allergic contact dermatitis due to plants, except food: Secondary | ICD-10-CM

## 2013-12-07 DIAGNOSIS — L255 Unspecified contact dermatitis due to plants, except food: Secondary | ICD-10-CM

## 2013-12-07 MED ORDER — METHYLPREDNISOLONE (PAK) 4 MG PO TABS: ORAL_TABLET | ORAL | Status: DC

## 2013-12-07 MED ORDER — CLOBETASOL PROPIONATE 0.05 % EX CREA: 1.0000 "application " | TOPICAL_CREAM | Freq: Two times a day (BID) | CUTANEOUS | Status: DC

## 2013-12-07 NOTE — Patient Instructions (Signed)
Start the topical steroid, use the triple antibiotic cream  Try the steroids by mouth  F/u as needed

## 2013-12-07 NOTE — Progress Notes (Signed)
Patient ID: Rachel French, female   DOB: 09/03/1975, 38 y.o.   MRN: [id number]   Subjective:    Patient ID: Rachel French, female    DOB: 09/03/1975, 38 y.o.   MRN: [id number]  [facility] here with poison oak dermatitis. She was out cutting some wheezing but she is down on Fourth of July she subsequently broke out in a rash. [facility] was given a Depo-Medrol injection however because in the past she had some mood changes with prednisone a weary about putting her back on a. She's tried over-the-counter calamine lotion Benadryl everything however the poison oak rash continues to cause itching and is now blistering and not improving. She actually has the main rash on her right thigh she also has a large spot on her right arm and a few lesions on her fingers a spot on her right side of her face. She is willing to try the prednisone again as that was many years ago    Review Of Systems:  GEN- denies fatigue, fever, weight loss,weakness, recent illness HEENT- denies eye drainage, change in vision, nasal discharge, CVS- denies chest pain, palpitations RESP- denies SOB, cough, wheeze MSK- denies joint pain, muscle aches, injury Neuro- denies headache, dizziness, syncope, seizure activity       Objective:    BP 128/76  Pulse 64  Temp(Src) 98.7 F (37.1 C) (Oral)  Resp 14  Ht 5\' 2"  (1.575 m)  Wt 184 lb (83.462 kg)  BMI 33.65 kg/m2 GEN- NAD, alert and oriented x3 Skin- blistering erythematous rash on right thiigh with erytehamtout maculopapular lesions mixed in, linear rash extending from patch, maculopapular rash with mild blisters on right upper arm, few lesions on 2nd and 3rd digit, erythematous patch on right forehead and right neck        Assessment & Plan:      Problem List Items Addressed This Visit   None    Visit Diagnoses   Poison oak dermatitis    -  Primary    start medrol dosepak and topical clobetasol,  discussed side effects of medication, if she has any problems with the medrol dosepak will discontin       Note: This dictation was prepared with Dragon dictation along with smaller phrase technology. Any transcriptional errors that result from this process are unintentional.

## 2014-01-16 ENCOUNTER — Encounter: Payer: Self-pay | Admitting: Family Medicine

## 2014-01-16 ENCOUNTER — Ambulatory Visit (INDEPENDENT_AMBULATORY_CARE_PROVIDER_SITE_OTHER): Payer: BC Managed Care – PPO | Admitting: Family Medicine

## 2014-01-16 VITALS — BP 128/76 | HR 68 | Temp 98.0°F | Resp 12 | Ht 60.0 in | Wt 178.0 lb

## 2014-01-16 DIAGNOSIS — F988 Other specified behavioral and emotional disorders with onset usually occurring in childhood and adolescence: Secondary | ICD-10-CM

## 2014-01-16 MED ORDER — LISDEXAMFETAMINE DIMESYLATE 50 MG PO CAPS: 50.0000 mg | ORAL_CAPSULE | Freq: Every day | ORAL | Status: DC

## 2014-01-16 NOTE — Patient Instructions (Signed)
Continue the vyvanse at current dose Release of records-- Boneta Lucksarla [facility] F/U 3 months

## 2014-01-17 ENCOUNTER — Encounter: Payer: Self-pay | Admitting: Family Medicine

## 2014-01-17 DIAGNOSIS — F988 Other specified behavioral and emotional disorders with onset usually occurring in childhood and adolescence: Secondary | ICD-10-CM | POA: Insufficient documentation

## 2014-01-17 NOTE — Progress Notes (Signed)
Patient ID: Rachel French, female   DOB: [birth year], 38 y.o.   MRN: [id number]   Subjective:    Patient ID: Rachel French, female    DOB: [birth year], 38 y.o.   MRN: [id number]  Patient presents for ADD Pt here for medication management. Was seen by psychologist Rachel French, [address] Her daughter was being evaluated she has ADHD, during the visits Rachel FellingCarla noted ADD symptoms in Rachel French, she had formal testing 2 weeks ago, results showed ADD. She started Vyvanse 50mg  once a day , which she had at home from her daughter, was advised this was okay by the psychologist office. She is doing well on the medication, her appetite has curved some but she notices a difference at work, works in [employer].    Review Of Systems:  GEN- denies fatigue, fever, weight loss,weakness, recent illness HEENT- denies eye drainage, change in vision, nasal discharge, CVS- denies chest pain, palpitations RESP- denies SOB, cough, wheeze Neuro- denies headache, dizziness, syncope, seizure activity       Objective:    BP 128/76  Pulse 68  Temp(Src) 98 F (36.7 C) (Oral)  Resp 12  Ht 5' (1.524 m)  Wt 178 lb (80.74 kg)  BMI 34.76 kg/m2 GEN- NAD, alert and oriented x3 Psych- normal affect and mood, very bubbly personality        Assessment & Plan:      Problem List Items Addressed This Visit   ADD (attention deficit disorder) - Primary     Continue vyvanse 50mg , obtain the records from psychologist Given scripts for Aug, Sept, Oct Discussed side effects of medication       Note: This dictation was prepared with Nurse, children'sDragon dictation along with smaller Lobbyistphrase technology. Any transcriptional errors that result from this process are unintentional.

## 2014-01-17 NOTE — Assessment & Plan Note (Signed)
Continue vyvanse 50mg , obtain the records from psychologist Given scripts for Aug, Sept, Oct Discussed side effects of medication

## 2014-01-21 ENCOUNTER — Telehealth: Payer: Self-pay | Admitting: *Deleted

## 2014-01-21 NOTE — Telephone Encounter (Signed)
Received fax from pharmacy requesting PA for Vu\yvanse.   PA submitted.   Received PA determination.   PA approved.

## 2014-02-07 ENCOUNTER — Telehealth: Payer: Self-pay | Admitting: Family Medicine

## 2014-02-07 NOTE — Telephone Encounter (Signed)
Patient is calling to say that the vyvance 50 mg is too strong would like to go to a lower dose if possible  [phone]

## 2014-02-07 NOTE — Telephone Encounter (Signed)
I will forward to [facility]

## 2014-02-07 NOTE — Telephone Encounter (Signed)
Call placed to patient. LMTRC.  

## 2014-02-07 NOTE — Telephone Encounter (Signed)
Call returned from patient.   Reports that she is more jittery on the increased dose of Vyvanse and her mind wanders more.   MD please advise.

## 2014-02-08 MED ORDER — LISDEXAMFETAMINE DIMESYLATE 40 MG PO CAPS: 40.0000 mg | ORAL_CAPSULE | ORAL | Status: DC

## 2014-02-08 NOTE — Telephone Encounter (Signed)
Change dose to Vyvanse  once a day  #30 R 0 Pt needs to pick up script

## 2014-02-08 NOTE — Telephone Encounter (Signed)
RX printed. Patient notified.

## 2014-04-01 ENCOUNTER — Encounter: Payer: Self-pay | Admitting: Family Medicine

## 2014-04-19 ENCOUNTER — Encounter: Payer: Self-pay | Admitting: Family Medicine

## 2014-04-19 ENCOUNTER — Ambulatory Visit (INDEPENDENT_AMBULATORY_CARE_PROVIDER_SITE_OTHER): Payer: BC Managed Care – PPO | Admitting: Family Medicine

## 2014-04-19 VITALS — BP 122/64 | HR 68 | Temp 98.5°F | Resp 14 | Ht 60.0 in | Wt 161.0 lb

## 2014-04-19 DIAGNOSIS — F909 Attention-deficit hyperactivity disorder, unspecified type: Secondary | ICD-10-CM

## 2014-04-19 DIAGNOSIS — S51859A Open bite of unspecified forearm, initial encounter: Secondary | ICD-10-CM | POA: Insufficient documentation

## 2014-04-19 DIAGNOSIS — W540XXD Bitten by dog, subsequent encounter: Secondary | ICD-10-CM

## 2014-04-19 DIAGNOSIS — F988 Other specified behavioral and emotional disorders with onset usually occurring in childhood and adolescence: Secondary | ICD-10-CM

## 2014-04-19 DIAGNOSIS — W540XXA Bitten by dog, initial encounter: Secondary | ICD-10-CM | POA: Insufficient documentation

## 2014-04-19 DIAGNOSIS — S51851D Open bite of right forearm, subsequent encounter: Secondary | ICD-10-CM

## 2014-04-19 MED ORDER — AMOXICILLIN-POT CLAVULANATE 875-125 MG PO TABS: 1.0000 | ORAL_TABLET | Freq: Two times a day (BID) | ORAL | Status: DC

## 2014-04-19 MED ORDER — LISDEXAMFETAMINE DIMESYLATE 50 MG PO CAPS: 50.0000 mg | ORAL_CAPSULE | Freq: Every day | ORAL | Status: DC

## 2014-04-19 NOTE — Progress Notes (Signed)
Patient ID: Rachel French, female   DOB: August 01, 1975, 38 y.o.   MRN: [id number]   Subjective:    Patient ID: Rachel French, female    DOB: August 01, 1975, 38 y.o.   MRN: [id number]  Patient presents for 3 month F/U and R Arm Injury patient here for follow-up ADD, taking Vyvnase 50mg  once a day, focus and attention span are good especially during work hours. Sustained dog bite that was provoked by her own pet 2 weeks ago, she was keeping clean but a few days later began to feel light headed nausea, with vomiting, seen at Urgent care, had UTI, given Bactrim, TDAP was UTD. She had fallen since the initial bite which caused blistering of her wound and drainage. Currently has mild tenderness, but still has some draiange from one of the bites, she also has significant bruising and it does not seem to be healing quickly  Review Of Systems:  GEN- denies fatigue, fever, weight loss,weakness, recent illness HEENT- denies eye drainage, change in vision, nasal discharge, CVS- denies chest pain, palpitations RESP- denies SOB, cough, wheeze ABD- denies N/V, change in stools, abd pain GU- denies dysuria, hematuria, dribbling, incontinence MSK- denies joint pain, muscle aches, injury Neuro- denies headache, dizziness, syncope, seizure activity       Objective:    BP 122/64 mmHg  Pulse 68  Temp(Src) 98.5 F (36.9 C) (Oral)  Resp 14  Ht 5' (1.524 m)  Wt 161 lb (73.029 kg)  BMI 31.44 kg/m2 GEN- NAD, alert and oriented x3 HEENT- PERRL, EOMI, non injected sclera, pink conjunctiva, MMM, oropharynx clear Neck- Supple, no LAD CVS- RRR, no murmur RESP-CTAB Psych- normal affect and mood Skin- Right foerarm- 2 puncture marks, one scabbed over, second lesion in middle of granulation tissue and erythema - yellow drainage noted, hyperpigemnted scan in center of large wound- surrounding erythema on borders covers about 1/3 of dorsum of forearm EXT- No edema Pulses- Radial 2+        Assessment & Plan:       Problem List Items Addressed This Visit    None      Note: This dictation was prepared with Dragon dictation along with smaller phrase technology. Any transcriptional errors that result from this process are unintentional.

## 2014-04-19 NOTE — Assessment & Plan Note (Signed)
Continue vyvnase, doing well, scripts given for Dec, Jan, Feb

## 2014-04-19 NOTE — Patient Instructions (Signed)
Take the augmentin twice a day  Vyvanse for next 3 months given F/u 6 months

## 2014-04-19 NOTE — Assessment & Plan Note (Signed)
TDAP UTD, she has good granulation tissue from where she blistered and skin opened, but still has large hyperpigmented area of bruising/scab with erythema, with the drainage i am concerned all of infection has not cleared. Will place her on augmentin BID for next 10 days, she will call next week to give me update if not better send to dermatology

## 2014-07-11 ENCOUNTER — Telehealth: Payer: Self-pay | Admitting: Family Medicine

## 2014-07-11 NOTE — Telephone Encounter (Signed)
Patient has lost rx for her vyvanse would like to know if we can print another one for her  [phone]

## 2014-07-12 MED ORDER — LISDEXAMFETAMINE DIMESYLATE 50 MG PO CAPS: 50.0000 mg | ORAL_CAPSULE | Freq: Every day | ORAL | Status: DC

## 2014-07-12 NOTE — Telephone Encounter (Signed)
Okay to give script for Feb and March

## 2014-07-12 NOTE — Telephone Encounter (Signed)
Prescriptions printed and patient made aware to come to office to pick up.  

## 2014-07-17 ENCOUNTER — Other Ambulatory Visit: Payer: Self-pay | Admitting: Gynecology

## 2014-08-16 ENCOUNTER — Other Ambulatory Visit: Payer: Self-pay | Admitting: Gynecology

## 2014-09-02 ENCOUNTER — Encounter: Payer: Self-pay | Admitting: Gynecology

## 2014-09-02 ENCOUNTER — Other Ambulatory Visit (HOSPITAL_COMMUNITY)
Admission: RE | Admit: 2014-09-02 | Discharge: 2014-09-02 | Disposition: A | Discharge status: Home / Self Care | Payer: BC Managed Care – PPO | Source: Ambulatory Visit | Attending: Gynecology | Admitting: Gynecology

## 2014-09-02 ENCOUNTER — Ambulatory Visit (INDEPENDENT_AMBULATORY_CARE_PROVIDER_SITE_OTHER): Payer: BC Managed Care – PPO | Admitting: Gynecology

## 2014-09-02 VITALS — BP 122/76 | Ht 61.0 in | Wt 157.0 lb

## 2014-09-02 DIAGNOSIS — Z01419 Encounter for gynecological examination (general) (routine) without abnormal findings: Secondary | ICD-10-CM | POA: Insufficient documentation

## 2014-09-02 LAB — CBC WITH DIFFERENTIAL/PLATELET
BASOS ABS: 0 10*3/uL (ref 0.0–0.1)
Basophils Relative: 0 % (ref 0–1)
EOS PCT: 3 % (ref 0–5)
Eosinophils Absolute: 0.3 10*3/uL (ref 0.0–0.7)
HEMATOCRIT: 39.6 % (ref 36.0–46.0)
Hemoglobin: 13.3 g/dL (ref 12.0–15.0)
LYMPHS ABS: 3.6 10*3/uL (ref 0.7–4.0)
LYMPHS PCT: 39 % (ref 12–46)
MCH: 30.9 pg (ref 26.0–34.0)
MCHC: 33.6 g/dL (ref 30.0–36.0)
MCV: 92.1 fL (ref 78.0–100.0)
MONOS PCT: 10 % (ref 3–12)
MPV: 10.7 fL (ref 8.6–12.4)
Monocytes Absolute: 0.9 10*3/uL (ref 0.1–1.0)
Neutro Abs: 4.5 10*3/uL (ref 1.7–7.7)
Neutrophils Relative %: 48 % (ref 43–77)
Platelets: 405 10*3/uL — ABNORMAL HIGH (ref 150–400)
RBC: 4.3 MIL/uL (ref 3.87–5.11)
RDW: 13.6 % (ref 11.5–15.5)
WBC: 9.3 10*3/uL (ref 4.0–10.5)

## 2014-09-02 LAB — LIPID PANEL
CHOL/HDL RATIO: 2.8 ratio
Cholesterol: 166 mg/dL (ref 0–200)
HDL: 60 mg/dL (ref >=46)
LDL CALC: 78 mg/dL (ref 0–99)
Triglycerides: 139 mg/dL (ref <150)
VLDL: 28 mg/dL (ref 0–40)

## 2014-09-02 LAB — COMPREHENSIVE METABOLIC PANEL
ALBUMIN: 4.1 g/dL (ref 3.5–5.2)
ALT: 19 U/L (ref 0–35)
AST: 15 U/L (ref 0–37)
Alkaline Phosphatase: 61 U/L (ref 39–117)
BUN: 11 mg/dL (ref 6–23)
CALCIUM: 9.3 mg/dL (ref 8.4–10.5)
CHLORIDE: 100 meq/L (ref 96–112)
CO2: 24 meq/L (ref 19–32)
Creat: 0.75 mg/dL (ref 0.50–1.10)
Glucose, Bld: 100 mg/dL — ABNORMAL HIGH (ref 70–99)
POTASSIUM: 3.9 meq/L (ref 3.5–5.3)
Sodium: 136 mEq/L (ref 135–145)
Total Bilirubin: 0.3 mg/dL (ref 0.2–1.2)
Total Protein: 7.7 g/dL (ref 6.0–8.3)

## 2014-09-02 MED ORDER — DROSPIRENONE-ETHINYL ESTRADIOL 3-0.02 MG PO TABS: 1.0000 | ORAL_TABLET | Freq: Every day | ORAL | Status: DC

## 2014-09-02 NOTE — Patient Instructions (Signed)
You may obtain a copy of any labs that were done today by logging onto MyChart as outlined in the instructions provided with your AVS (after visit summary). The office will not call with normal lab results but certainly if there are any significant abnormalities then we will contact you.   Health Maintenance, Female A healthy lifestyle and preventative care can promote health and wellness.  Maintain regular health, dental, and eye exams.  Eat a healthy diet. Foods like vegetables, fruits, whole grains, low-fat dairy products, and lean protein foods contain the nutrients you need without too many calories. Decrease your intake of foods high in solid fats, added sugars, and salt. Get information about a proper diet from your caregiver, if necessary.  Regular physical exercise is one of the most important things you can do for your health. Most adults should get at least 150 minutes of moderate-intensity exercise (any activity that increases your heart rate and causes you to sweat) each week. In addition, most adults need muscle-strengthening exercises on 2 or more days a week.   Maintain a healthy weight. The body mass index (BMI) is a screening tool to identify possible weight problems. It provides an estimate of body fat based on height and weight. Your caregiver can help determine your BMI, and can help you achieve or maintain a healthy weight. For adults 20 years and older:  A BMI below 18.5 is considered underweight.  A BMI of 18.5 to 24.9 is normal.  A BMI of 25 to 29.9 is considered overweight.  A BMI of 30 and above is considered obese.  Maintain normal blood lipids and cholesterol by exercising and minimizing your intake of saturated fat. Eat a balanced diet with plenty of fruits and vegetables. Blood tests for lipids and cholesterol should begin at age 61 and be repeated every 5 years. If your lipid or cholesterol levels are high, you are over 50, or you are a high risk for heart  disease, you may need your cholesterol levels checked more frequently.Ongoing high lipid and cholesterol levels should be treated with medicines if diet and exercise are not effective.  If you smoke, find out from your caregiver how to quit. If you do not use tobacco, do not start.  Lung cancer screening is recommended for adults aged 33 80 years who are at high risk for developing lung cancer because of a history of smoking. Yearly low-dose computed tomography (CT) is recommended for people who have at least a 30-pack-year history of smoking and are a current smoker or have quit within the past 15 years. A pack year of smoking is smoking an average of 1 pack of cigarettes a day for 1 year (for example: 1 pack a day for 30 years or 2 packs a day for 15 years). Yearly screening should continue until the smoker has stopped smoking for at least 15 years. Yearly screening should also be stopped for people who develop a health problem that would prevent them from having lung cancer treatment.  If you are pregnant, do not drink alcohol. If you are breastfeeding, be very cautious about drinking alcohol. If you are not pregnant and choose to drink alcohol, do not exceed 1 drink per day. One drink is considered to be 12 ounces (355 mL) of beer, 5 ounces (148 mL) of wine, or 1.5 ounces (44 mL) of liquor.  Avoid use of street drugs. Do not share needles with anyone. Ask for help if you need support or instructions about stopping  the use of drugs.  High blood pressure causes heart disease and increases the risk of stroke. Blood pressure should be checked at least every 1 to 2 years. Ongoing high blood pressure should be treated with medicines, if weight loss and exercise are not effective.  If you are 59 to 38 years old, ask your caregiver if you should take aspirin to prevent strokes.  Diabetes screening involves taking a blood sample to check your fasting blood sugar level. This should be done once every 3  years, after age 91, if you are within normal weight and without risk factors for diabetes. Testing should be considered at a younger age or be carried out more frequently if you are overweight and have at least 1 risk factor for diabetes.  Breast cancer screening is essential preventative care for women. You should practice "breast self-awareness." This means understanding the normal appearance and feel of your breasts and may include breast self-examination. Any changes detected, no matter how small, should be reported to a caregiver. Women in their 66s and 30s should have a clinical breast exam (CBE) by a caregiver as part of a regular health exam every 1 to 3 years. After age 101, women should have a CBE every year. Starting at age 100, women should consider having a mammogram (breast X-ray) every year. Women who have a family history of breast cancer should talk to their caregiver about genetic screening. Women at a high risk of breast cancer should talk to their caregiver about having an MRI and a mammogram every year.  Breast cancer gene (BRCA)-related cancer risk assessment is recommended for women who have family members with BRCA-related cancers. BRCA-related cancers include breast, ovarian, tubal, and peritoneal cancers. Having family members with these cancers may be associated with an increased risk for harmful changes (mutations) in the breast cancer genes BRCA1 and BRCA2. Results of the assessment will determine the need for genetic counseling and BRCA1 and BRCA2 testing.  The Pap test is a screening test for cervical cancer. Women should have a Pap test starting at age 57. Between ages 25 and 35, Pap tests should be repeated every 2 years. Beginning at age 37, you should have a Pap test every 3 years as long as the past 3 Pap tests have been normal. If you had a hysterectomy for a problem that was not cancer or a condition that could lead to cancer, then you no longer need Pap tests. If you are  between ages 50 and 76, and you have had normal Pap tests going back 10 years, you no longer need Pap tests. If you have had past treatment for cervical cancer or a condition that could lead to cancer, you need Pap tests and screening for cancer for at least 20 years after your treatment. If Pap tests have been discontinued, risk factors (such as a new sexual partner) need to be reassessed to determine if screening should be resumed. Some women have medical problems that increase the chance of getting cervical cancer. In these cases, your caregiver may recommend more frequent screening and Pap tests.  The human papillomavirus (HPV) test is an additional test that may be used for cervical cancer screening. The HPV test looks for the virus that can cause the cell changes on the cervix. The cells collected during the Pap test can be tested for HPV. The HPV test could be used to screen women aged 44 years and older, and should be used in women of any age  who have unclear Pap test results. After the age of 55, women should have HPV testing at the same frequency as a Pap test.  Colorectal cancer can be detected and often prevented. Most routine colorectal cancer screening begins at the age of 44 and continues through age 20. However, your caregiver may recommend screening at an earlier age if you have risk factors for colon cancer. On a yearly basis, your caregiver may provide home test kits to check for hidden blood in the stool. Use of a small camera at the end of a tube, to directly examine the colon (sigmoidoscopy or colonoscopy), can detect the earliest forms of colorectal cancer. Talk to your caregiver about this at age 86, when routine screening begins. Direct examination of the colon should be repeated every 5 to 10 years through age 13, unless early forms of pre-cancerous polyps or small growths are found.  Hepatitis C blood testing is recommended for all people born from [birth year] through 1964 and any  individual with known risks for hepatitis C.  Practice safe sex. Use condoms and avoid high-risk sexual practices to reduce the spread of sexually transmitted infections (STIs). Sexually active women aged 36 and younger should be checked for Chlamydia, which is a common sexually transmitted infection. Older women with new or multiple partners should also be tested for Chlamydia. Testing for other STIs is recommended if you are sexually active and at increased risk.  Osteoporosis is a disease in which the bones lose minerals and strength with aging. This can result in serious bone fractures. The risk of osteoporosis can be identified using a bone density scan. Women ages 20 and over and women at risk for fractures or osteoporosis should discuss screening with their caregivers. Ask your caregiver whether you should be taking a calcium supplement or vitamin D to reduce the rate of osteoporosis.  Menopause can be associated with physical symptoms and risks. Hormone replacement therapy is available to decrease symptoms and risks. You should talk to your caregiver about whether hormone replacement therapy is right for you.  Use sunscreen. Apply sunscreen liberally and repeatedly throughout the day. You should seek shade when your shadow is shorter than you. Protect yourself by wearing long sleeves, pants, a wide-brimmed hat, and sunglasses year round, whenever you are outdoors.  Notify your caregiver of new moles or changes in moles, especially if there is a change in shape or color. Also notify your caregiver if a mole is larger than the size of a pencil eraser.  Stay current with your immunizations. Document Released: 11/30/2010 Document Revised: 09/11/2012 Document Reviewed: 11/30/2010 [facility] Patient Information 2014 [employer].

## 2014-09-02 NOTE — Addendum Note (Signed)
Addended by: Dayna BarkerGARDNER, Keywon Mestre K on: 09/02/2014 04:18 PM   Modules accepted: Orders

## 2014-09-02 NOTE — Progress Notes (Signed)
Julian HyKelly Yarberry 01-12-1976 [id number]        39 y.o.  G2P0011 for annual exam.  Doing well without complaints.  Past medical history,surgical history, problem list, medications, allergies, family history and social history were all reviewed and documented as reviewed in the EPIC chart.  ROS:  Performed with pertinent positives and negatives included in the history, assessment and plan.   Additional significant findings :  none   Exam: [facility] person[facility]istant Filed Vitals:   09/02/14 1513  BP: 122/76  Height: 5\' 1"  (1.549 m)  Weight: 157 lb (71.215 kg)   General appearance:  Normal affect, orientation and appearance. Skin: Grossly normal HEENT: Without gross lesions.  No cervical or supraclavicular adenopathy. Thyroid normal.  Lungs:  Clear without wheezing, rales or rhonchi Cardiac: RR, without RMG Abdominal:  Soft, nontender, without masses, guarding, rebound, organomegaly or hernia Breasts:  Examined lying and sitting without masses, retractions, discharge or axillary adenopathy. Pelvic:  Ext/BUS/vagina normal  Cervix normal. Pap done  Uterus retroverted, normal size, shape and contour, midline and mobile nontender   Adnexa  Without masses or tenderness    Anus and perineum  Normal   Rectovaginal  Normal sphincter tone without palpated masses or tenderness.    Assessment/Plan:  39 y.o. 642P0011 female for annual exam with regular menses, oral contraceptives.   1. [name] oral contraceptives. Patient doing well and wants to continue. Never smoker and not being followed for diabetes or hypertension. Increased risk of thrombosis associated with drospirenone pills reviewed again. Patient understands and accepts and I refilled her 1 year. 2. Pap smear/HPV 07/2013 negative.  History of LEEP 1997. 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 Cytology was normal.  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 Pap smear/HPV last year normal. Pap smear done today. 3. Mammogram never. No history of breast cancer in the family. Screening mammographic recommendations between 35 and 40 reviewed. Patient prefers to wait until 40. SBE monthly reviewed. 4. Health maintenance.  Baseline CBC comprehensive metabolic panel lipid profile urinalysis ordered. Follow up 1 year, sooner as needed.     Dara LordsFONTAINE,TIMOTHY P MD, 3:35 PM 09/02/2014

## 2014-09-03 LAB — URINALYSIS W MICROSCOPIC + REFLEX CULTURE
BACTERIA UA: NONE SEEN
BILIRUBIN URINE: NEGATIVE
Casts: NONE SEEN
Crystals: NONE SEEN
Glucose, UA: NEGATIVE mg/dL
HGB URINE DIPSTICK: NEGATIVE
Ketones, ur: NEGATIVE mg/dL
Leukocytes, UA: NEGATIVE
Nitrite: NEGATIVE
Protein, ur: NEGATIVE mg/dL
SPECIFIC GRAVITY, URINE: 1.008 (ref 1.005–1.030)
Squamous Epithelial / LPF: NONE SEEN
Urobilinogen, UA: 0.2 mg/dL (ref 0.0–1.0)
pH: 5.5 (ref 5.0–8.0)

## 2014-09-04 LAB — CYTOLOGY - PAP

## 2014-09-20 ENCOUNTER — Telehealth: Payer: Self-pay | Admitting: Family Medicine

## 2014-09-20 MED ORDER — LISDEXAMFETAMINE DIMESYLATE 50 MG PO CAPS: 50.0000 mg | ORAL_CAPSULE | Freq: Every day | ORAL | Status: DC

## 2014-09-20 NOTE — Telephone Encounter (Signed)
Ok to refill??  Last office visit 04/19/2014.  Last refill 07/12/2014, #2 refills.

## 2014-09-20 NOTE — Telephone Encounter (Signed)
Patient is calling to get refill on her vyvanse  [phone]

## 2014-09-20 NOTE — Telephone Encounter (Signed)
Prescriptions printed x3 months and patient made aware to come to office to pick up.

## 2014-09-20 NOTE — Telephone Encounter (Signed)
Okay to refill? 

## 2014-10-18 ENCOUNTER — Encounter: Payer: Self-pay | Admitting: Family Medicine

## 2014-10-18 ENCOUNTER — Ambulatory Visit (INDEPENDENT_AMBULATORY_CARE_PROVIDER_SITE_OTHER): Payer: BC Managed Care – PPO | Admitting: Family Medicine

## 2014-10-18 VITALS — BP 118/76 | HR 93 | Temp 98.2°F | Resp 18 | Wt 161.0 lb

## 2014-10-18 DIAGNOSIS — F909 Attention-deficit hyperactivity disorder, unspecified type: Secondary | ICD-10-CM

## 2014-10-18 DIAGNOSIS — F988 Other specified behavioral and emotional disorders with onset usually occurring in childhood and adolescence: Secondary | ICD-10-CM

## 2014-10-18 MED ORDER — LISDEXAMFETAMINE DIMESYLATE 60 MG PO CAPS: 60.0000 mg | ORAL_CAPSULE | ORAL | Status: DC

## 2014-10-18 NOTE — Patient Instructions (Signed)
Try the 60mg  for a week and see if better than 50mg   F/U 6 months

## 2014-10-20 ENCOUNTER — Encounter: Payer: Self-pay | Admitting: Family Medicine

## 2014-10-20 NOTE — Assessment & Plan Note (Signed)
Given 1 month of Vyvanse 60mg  to try, she does take on weekends, she already has 50mg  Vyvanse at home, we will do a trial to see if benefits outweight risk, discussed eating on regular basis. Also some days she takes meds very early in AM and this will cause it to wear off before school ends as well.

## 2014-10-20 NOTE — Progress Notes (Signed)
Patient ID: Rachel French, female   DOB: [birth year], 39 y.o.   MRN: [id number]   Subjective:    Patient ID: Rachel French, female    DOB: [birth year], 39 y.o.   MRN: [id number]  Patient presents for Follow-up  Pt here to f/u medications, she has been on Vyvanse 50mg , she states it works fairly well but some days she still gets frazzels and the inattentiveness gets off track, she is in the last days of school with testing and evaluations and feels her meds do not work as well. Her daughter had 60mg  of Vyvanse she tried this and did better, but it did decrease her appetite. She wants to try taking 60mg  to see if this does better during the day for her.     Review Of Systems:  GEN- denies fatigue, fever, weight loss,weakness, recent illness HEENT- denies eye drainage, change in vision, nasal discharge, CVS- denies chest pain, palpitations RESP- denies SOB, cough, wheeze ABD- denies N/V, change in stools, abd pain GU- denies dysuria, hematuria, dribbling, incontinence MSK- denies joint pain, muscle aches, injury Neuro- denies headache, dizziness, syncope, seizure activity       Objective:    BP 118/76 mmHg  Pulse 93  Temp(Src) 98.2 F (36.8 C)  Resp 18  Wt 161 lb (73.029 kg)  SpO2 98% GEN- NAD, alert and oriented x3 Psych- hyper , but not overly anxious or depressed, good eye contact, well groomed, no hallucinations CVS-RRR, no murmur RESP-CTAB       Assessment & Plan:      Problem List Items Addressed This Visit    ADD (attention deficit disorder) - Primary      Note: This dictation was prepared with Dragon dictation along with smaller phrase technology. Any transcriptional errors that result from this process are unintentional.

## 2014-11-13 ENCOUNTER — Telehealth: Payer: Self-pay | Admitting: Family Medicine

## 2014-11-13 MED ORDER — LISDEXAMFETAMINE DIMESYLATE 60 MG PO CAPS
60.0000 mg | ORAL_CAPSULE | ORAL | Status: DC
Start: 2014-11-13 — End: 2014-12-03

## 2014-11-13 MED ORDER — LISDEXAMFETAMINE DIMESYLATE 60 MG PO CAPS
60.0000 mg | ORAL_CAPSULE | ORAL | Status: DC
Start: 2014-11-13 — End: 2014-11-13

## 2014-11-13 MED ORDER — LISDEXAMFETAMINE DIMESYLATE 60 MG PO CAPS: 60.0000 mg | ORAL_CAPSULE | ORAL | Status: DC

## 2014-11-13 NOTE — Telephone Encounter (Signed)
Prescription printed and patient made aware to come to office to pick up on 11/14/2014.

## 2014-11-13 NOTE — Telephone Encounter (Signed)
[phone] Pt is wanting a refill on her vyvanse 60 mg

## 2014-11-13 NOTE — Telephone Encounter (Signed)
Ok to refill??  Last office visit/ refill 10/18/2014. 

## 2014-11-13 NOTE — Telephone Encounter (Signed)
Okay to refill give 3 

## 2014-12-03 ENCOUNTER — Ambulatory Visit (INDEPENDENT_AMBULATORY_CARE_PROVIDER_SITE_OTHER): Payer: BC Managed Care – PPO | Admitting: Family Medicine

## 2014-12-03 ENCOUNTER — Encounter: Payer: Self-pay | Admitting: Family Medicine

## 2014-12-03 VITALS — BP 130/74 | HR 68 | Temp 98.9°F | Resp 14 | Ht 61.0 in | Wt 161.0 lb

## 2014-12-03 DIAGNOSIS — B001 Herpesviral vesicular dermatitis: Secondary | ICD-10-CM | POA: Diagnosis not present

## 2014-12-03 MED ORDER — VALACYCLOVIR HCL 1 G PO TABS: 1000.0000 mg | ORAL_TABLET | Freq: Two times a day (BID) | ORAL | Status: DC

## 2014-12-03 MED ORDER — LISDEXAMFETAMINE DIMESYLATE 50 MG PO CAPS: 50.0000 mg | ORAL_CAPSULE | Freq: Every day | ORAL | Status: DC

## 2014-12-03 NOTE — Progress Notes (Signed)
Patient ID: Rachel French, female   DOB: [birth year], 39 y.o.   MRN: [id number]   Subjective:    Patient ID: Rachel HyKelly Chauncey, female    DOB: [birth year], 39 y.o.   MRN: [id number]  Patient presents for Cold Sore to Upper Lip  patient here with cold sore she has history of cold sores in the past. One popped up about a week ago she used some topical acyclovir but this did not help very much. She had 2 Valtrex pills left over she took these and initial cold sore went down but she had another one pop-up right sided. She denies any other upper respiratory symptoms no other rash no fever.  Note she feels like the 50 mg of Vyvanse works better for her than the 60 mg  Review Of Systems: per above   GEN- denies fatigue, fever, weight loss,weakness, recent illness HEENT- denies eye drainage, change in vision, nasal discharge, CVS- denies chest pain, palpitations RESP- denies SOB, cough, wheeze ABD- denies N/V, change in stools, abd pain GU- denies dysuria, hematuria, dribbling, incontinence MSK- denies joint pain, muscle aches, injury Neuro- denies headache, dizziness, syncope, seizure activity       Objective:    BP 130/74 mmHg  Pulse 68  Temp(Src) 98.9 F (37.2 C) (Oral)  Resp 14  Ht 5\' 1"  (1.549 m)  Wt 161 lb (73.029 kg)  BMI 30.44 kg/m2 GEN- NAD, alert and oriented x3 HEENT- PERRL, EOMI, non injected sclera, pink conjunctiva, MMM, oropharynx clear Neck- Supple, noLAD Skin- upper lip- crusted cold sore, mild TTP, no erythema, no drainage, no mucosal ulcerations noted       Assessment & Plan:      Problem List Items Addressed This Visit    None    Visit Diagnoses    Herpes labialis    -  Primary    Valtrex 1 gram BID x 5 days, others pt can keep on h and    Relevant Medications    valACYclovir (VALTREX) 1000 MG tablet       Note: This dictation was prepared with Dragon dictation along with smaller phrase technology. Any transcriptional errors that result from this process are  unintentional.

## 2014-12-03 NOTE — Patient Instructions (Signed)
Vatlrex take 1 tablet twice a day for 5 days  F/U as needed

## 2015-01-24 ENCOUNTER — Telehealth: Payer: Self-pay | Admitting: Family Medicine

## 2015-01-24 NOTE — Telephone Encounter (Signed)
PA for Vyvanse thru [id number].  PA request states Drug is covered by current benefit plan.  No further PA activity needed.  Pharmacy made aware

## 2015-01-24 NOTE — Telephone Encounter (Signed)
Pharmacy still getting PA needed indicator for Vyvanse.  I called [facility].  Told them EPA telling me not needed.  PA is needed and processed over the phone. Approved for one year 01/03/15 - 01/24/16  Ref# [id number].  Pharmacy called and made aware.

## 2015-02-18 ENCOUNTER — Encounter: Payer: Self-pay | Admitting: Family Medicine

## 2015-02-25 ENCOUNTER — Telehealth: Payer: Self-pay | Admitting: Family Medicine

## 2015-02-25 MED ORDER — LISDEXAMFETAMINE DIMESYLATE 50 MG PO CAPS: 50.0000 mg | ORAL_CAPSULE | Freq: Every day | ORAL | Status: DC

## 2015-02-25 MED ORDER — LISDEXAMFETAMINE DIMESYLATE 50 MG PO CAPS
50.0000 mg | ORAL_CAPSULE | Freq: Every day | ORAL | Status: DC
Start: 2015-02-25 — End: 2015-02-25

## 2015-02-25 NOTE — Addendum Note (Signed)
Addended by: Phillips Odor on: 02/25/2015 04:07 PM   Modules accepted: Orders

## 2015-02-25 NOTE — Telephone Encounter (Signed)
Prescription printed x3 and patient made aware to come to office to pick up on 02/26/2015.

## 2015-02-25 NOTE — Telephone Encounter (Signed)
Ok to refill??  Last office visit 12/03/2014.  Last refill 11/13/2014, #3 refills.

## 2015-02-25 NOTE — Telephone Encounter (Signed)
Okay to refill? 

## 2015-02-25 NOTE — Telephone Encounter (Signed)
Patient is asking for rx for her vyvanse

## 2015-02-28 ENCOUNTER — Telehealth: Payer: Self-pay | Admitting: Family Medicine

## 2015-02-28 MED ORDER — IBUPROFEN 600 MG PO TABS: 600.0000 mg | ORAL_TABLET | Freq: Three times a day (TID) | ORAL | Status: DC | PRN

## 2015-02-28 NOTE — Telephone Encounter (Signed)
Needs OV, add ibuprofen  Q 8 hrs with food  alterante heat and ICE for now

## 2015-02-28 NOTE — Telephone Encounter (Signed)
Pt has appt for Monday.  Told provider recommendations and Rx to pharmacy

## 2015-02-28 NOTE — Telephone Encounter (Signed)
Pulled muscle in left shoulder on Monday.  Has progressively gotten worse.  Now has pain and tingling going down arm.  Has appt Monday.  Has been using OTC's, heat, Tramadol.  Can she get something for over the weekend.

## 2015-03-03 ENCOUNTER — Ambulatory Visit: Payer: Self-pay | Admitting: Physician Assistant

## 2015-04-21 ENCOUNTER — Encounter: Payer: Self-pay | Admitting: Family Medicine

## 2015-04-21 ENCOUNTER — Ambulatory Visit (INDEPENDENT_AMBULATORY_CARE_PROVIDER_SITE_OTHER): Payer: BC Managed Care – PPO | Admitting: Family Medicine

## 2015-04-21 VITALS — BP 126/70 | HR 82 | Resp 14 | Ht 61.0 in | Wt 171.0 lb

## 2015-04-21 DIAGNOSIS — F909 Attention-deficit hyperactivity disorder, unspecified type: Secondary | ICD-10-CM

## 2015-04-21 DIAGNOSIS — G542 Cervical root disorders, not elsewhere classified: Secondary | ICD-10-CM

## 2015-04-21 DIAGNOSIS — F988 Other specified behavioral and emotional disorders with onset usually occurring in childhood and adolescence: Secondary | ICD-10-CM

## 2015-04-21 MED ORDER — LISDEXAMFETAMINE DIMESYLATE 50 MG PO CAPS: 50.0000 mg | ORAL_CAPSULE | Freq: Every day | ORAL | Status: DC

## 2015-04-21 MED ORDER — VALACYCLOVIR HCL 1 G PO TABS: 1000.0000 mg | ORAL_TABLET | Freq: Two times a day (BID) | ORAL | Status: DC

## 2015-04-21 MED ORDER — GABAPENTIN 100 MG PO CAPS: ORAL_CAPSULE | ORAL | Status: DC

## 2015-04-21 NOTE — Assessment & Plan Note (Signed)
Would try her on gabapentin at bedtime sugars start with 100 mg and work up to 300. She discontinue the Flexeril to hopefully decrease the use of this. I will obtain her MRI as well as the records from orthopedics in the urgent care. She should help fully be having surgery within the next month or so.

## 2015-04-21 NOTE — Patient Instructions (Addendum)
Release of records- Dr. Otelia SergeantNitka orthopedics [facility]  Try the gabapentin at bedtime -start the with capsule and work up to 3 capsules F/U 6 months

## 2015-04-21 NOTE — Progress Notes (Signed)
Patient ID: Rachel French, female   DOB: [birth year], 39 y.o.   MRN: [id number]   Subjective:    Patient ID: Rachel French, female    DOB: [birth year], 39 y.o.   MRN: [id number]  Patient presents for 6 month F/u   patient follow-up chronic problems. 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 She had an MRI does show multiple disc bulge as well as nerve impingement at C6. She is receiving orthopedics Dr. Otelia SergeantNitka and has been told that she will need surgery. She is currently on Flexeril and Percocet however Flexeril makes her drowsy into the next day. She is still taking her Adderall when she misses a couple days that she can tell as far as her concentration. She tells me that because she had not been sleeping very well she's been very forgetful and she was caught shoplifting though she just forgot something was underneath her card however she has been cited with this.    Review Of Systems:  GEN- denies fatigue, fever, weight loss,weakness, recent illness HEENT- denies eye drainage, change in vision, nasal discharge, CVS- denies chest pain, palpitations RESP- denies SOB, cough, wheeze ABD- denies N/V, change in stools, abd pain GU- denies dysuria, hematuria, dribbling, incontinence MSK- + joint pain, muscle aches, injury Neuro- denies headache, dizziness, syncope, seizure activity       Objective:    BP 126/70 mmHg  Pulse 82  Resp 14  Ht 5\' 1"  (1.549 m)  Wt 171 lb (77.565 kg)  BMI 32.33 kg/m2 GEN- NAD, alert and oriented x3 HEENT- PERRL, EOMI, non injected sclera, pink conjunctiva, MMM, oropharynx clear Neck- Supple, fair ROM, mild spasm CVS- RRR, no murmur RESP-CTAB Psych- normal affect and mood Pulses- Radial - 2+        Assessment & Plan:      Problem List Items Addressed This Visit    Cervical nerve root impingement    Would try her on gabapentin at bedtime sugars start with 100 mg and work up to 300.  She discontinue the Flexeril to hopefully decrease the use of this. I will obtain her MRI as well as the records from orthopedics in the urgent care. She should help fully be having surgery within the next month or so.      Relevant Medications   cyclobenzaprine (FLEXERIL) 10 MG tablet   lisdexamfetamine (VYVANSE) 50 MG capsule   gabapentin (NEURONTIN) 100 MG capsule   ADD (attention deficit disorder) - Primary    She will continue her Vyvanse at the current dose.         Note: This dictation was prepared with Dragon dictation along with smaller phrase technology. Any transcriptional errors that result from this process are unintentional.

## 2015-04-21 NOTE — Assessment & Plan Note (Signed)
She will continue her Vyvanse at the current dose.

## 2015-05-01 ENCOUNTER — Other Ambulatory Visit (HOSPITAL_COMMUNITY): Payer: Self-pay | Admitting: Specialist

## 2015-05-05 NOTE — Pre-Procedure Instructions (Signed)
Rachel French  05/05/2015     Your procedure is scheduled on : Friday May 09, 2015 at 12:30 PM.  [facility].  Call this number if you have problems the morning of surgery: [phone]    Remember:  Do not eat food or drink liquids after midnight.  Take these medicines the morning of surgery with A SIP OF WATER : Birth control pill, Valacyclovir (Valtrex) if needed   Stop taking any vitamins, herbal medications, Ibuprofen, Advil, Motrin, Aleve, etc   Do not wear jewelry, make-up or nail polish.  Do not wear lotions, powders, or perfumes.    Do not shave 48 hours prior to surgery.    Do not bring valuables to the hospital.  [facility] any belongings or valuables.  Contacts, dentures or bridgework may not be worn into surgery.  Leave your suitcase in the car.  After surgery it may be brought to your room.  For patients admitted to the hospital, discharge time will be determined by your treatment team.  Patients discharged the day of surgery will not be allowed to drive home.   Name and phone number of your driver:    Special instructions:  Shower using CHG soap the night before and the morning of your surgery  Please read over the following fact sheets that you were given. Pain Booklet, Coughing and Deep Breathing and Surgical Site Infection Prevention

## 2015-05-06 ENCOUNTER — Encounter (HOSPITAL_COMMUNITY)
Admission: RE | Admit: 2015-05-06 | Discharge: 2015-05-06 | Disposition: A | Discharge status: Home / Self Care | Payer: BC Managed Care – PPO | Source: Ambulatory Visit | Attending: Specialist | Admitting: Specialist

## 2015-05-06 ENCOUNTER — Encounter (HOSPITAL_COMMUNITY): Payer: Self-pay

## 2015-05-06 DIAGNOSIS — Z79899 Other long term (current) drug therapy: Secondary | ICD-10-CM | POA: Diagnosis not present

## 2015-05-06 DIAGNOSIS — M50323 Other cervical disc degeneration at C6-C7 level: Secondary | ICD-10-CM | POA: Diagnosis not present

## 2015-05-06 DIAGNOSIS — M50223 Other cervical disc displacement at C6-C7 level: Secondary | ICD-10-CM | POA: Diagnosis present

## 2015-05-06 DIAGNOSIS — F419 Anxiety disorder, unspecified: Secondary | ICD-10-CM | POA: Diagnosis not present

## 2015-05-06 HISTORY — DX: Personal history of urinary calculi: Z87.442

## 2015-05-06 HISTORY — DX: Anxiety disorder, unspecified: F41.9

## 2015-05-06 HISTORY — DX: Anesthesia of skin: R20.2

## 2015-05-06 HISTORY — DX: Nausea with vomiting, unspecified: R11.2

## 2015-05-06 HISTORY — DX: Anesthesia of skin: R20.0

## 2015-05-06 HISTORY — DX: Other specified postprocedural states: Z98.890

## 2015-05-06 LAB — BASIC METABOLIC PANEL
ANION GAP: 10 (ref 5–15)
BUN: 6 mg/dL (ref 6–20)
CALCIUM: 9.5 mg/dL (ref 8.9–10.3)
CO2: 26 mmol/L (ref 22–32)
Chloride: 104 mmol/L (ref 101–111)
Creatinine, Ser: 0.67 mg/dL (ref 0.44–1.00)
GFR calc Af Amer: >60 mL/min (ref >60)
GLUCOSE: 105 mg/dL — AB (ref 65–99)
Potassium: 4.3 mmol/L (ref 3.5–5.1)
Sodium: 140 mmol/L (ref 135–145)

## 2015-05-06 LAB — CBC
HCT: 41.7 % (ref 36.0–46.0)
Hemoglobin: 13.8 g/dL (ref 12.0–15.0)
MCH: 32.1 pg (ref 26.0–34.0)
MCHC: 33.1 g/dL (ref 30.0–36.0)
MCV: 97 fL (ref 78.0–100.0)
Platelets: 396 10*3/uL (ref 150–400)
RBC: 4.3 MIL/uL (ref 3.87–5.11)
RDW: 13.7 % (ref 11.5–15.5)
WBC: 9.7 10*3/uL (ref 4.0–10.5)

## 2015-05-06 LAB — SURGICAL PCR SCREEN
MRSA, PCR: NEGATIVE
Staphylococcus aureus: POSITIVE — AB

## 2015-05-06 LAB — HCG, SERUM, QUALITATIVE: Preg, Serum: NEGATIVE

## 2015-05-06 NOTE — Progress Notes (Signed)
[facility]  Patient denied having any cardiac or pulmonary issues

## 2015-05-06 NOTE — Progress Notes (Signed)
Nurse called patient and informed patient of positive PCR results, and informed patient that Betadine would be applied the day of surgery. Patient verbalized understanding.

## 2015-05-08 MED ORDER — CEFAZOLIN SODIUM-DEXTROSE 2-3 GM-% IV SOLR
2.0000 g | INTRAVENOUS | Status: AC
  Administered 2015-05-09: 2 g via INTRAVENOUS
  Filled 2015-05-08: qty 50

## 2015-05-09 ENCOUNTER — Observation Stay (HOSPITAL_COMMUNITY)
Admission: RE | Admit: 2015-05-09 | Discharge: 2015-05-10 | Disposition: A | Discharge status: Home / Self Care | Payer: BC Managed Care – PPO | Source: Ambulatory Visit | Attending: Specialist | Admitting: Specialist

## 2015-05-09 ENCOUNTER — Encounter (HOSPITAL_COMMUNITY): Payer: Self-pay | Admitting: General Practice

## 2015-05-09 ENCOUNTER — Ambulatory Visit (HOSPITAL_COMMUNITY): Payer: BC Managed Care – PPO

## 2015-05-09 ENCOUNTER — Ambulatory Visit (HOSPITAL_COMMUNITY): Payer: BC Managed Care – PPO | Admitting: Anesthesiology

## 2015-05-09 ENCOUNTER — Encounter (HOSPITAL_COMMUNITY)
Admission: RE | Disposition: A | Discharge status: Home / Self Care | Payer: Self-pay | Source: Ambulatory Visit | Attending: Specialist

## 2015-05-09 DIAGNOSIS — F419 Anxiety disorder, unspecified: Secondary | ICD-10-CM | POA: Insufficient documentation

## 2015-05-09 DIAGNOSIS — M50323 Other cervical disc degeneration at C6-C7 level: Secondary | ICD-10-CM | POA: Insufficient documentation

## 2015-05-09 DIAGNOSIS — M47812 Spondylosis without myelopathy or radiculopathy, cervical region: Secondary | ICD-10-CM | POA: Diagnosis present

## 2015-05-09 DIAGNOSIS — M5010 Cervical disc disorder with radiculopathy, unspecified cervical region: Secondary | ICD-10-CM | POA: Diagnosis present

## 2015-05-09 DIAGNOSIS — M50223 Other cervical disc displacement at C6-C7 level: Secondary | ICD-10-CM | POA: Diagnosis not present

## 2015-05-09 DIAGNOSIS — Z419 Encounter for procedure for purposes other than remedying health state, unspecified: Secondary | ICD-10-CM

## 2015-05-09 DIAGNOSIS — M5020 Other cervical disc displacement, unspecified cervical region: Secondary | ICD-10-CM | POA: Diagnosis present

## 2015-05-09 DIAGNOSIS — Z79899 Other long term (current) drug therapy: Secondary | ICD-10-CM | POA: Insufficient documentation

## 2015-05-09 HISTORY — PX: POSTERIOR CERVICAL FUSION/FORAMINOTOMY: SHX5038

## 2015-05-09 SURGERY — POSTERIOR CERVICAL FUSION/FORAMINOTOMY LEVEL 1
Anesthesia: General

## 2015-05-09 MED ORDER — OXYCODONE-ACETAMINOPHEN 5-325 MG PO TABS: 1.0000 | ORAL_TABLET | Freq: Four times a day (QID) | ORAL | Status: DC | PRN

## 2015-05-09 MED ORDER — SCOPOLAMINE 1 MG/3DAYS TD PT72
1.0000 | MEDICATED_PATCH | Freq: Once | TRANSDERMAL | Status: DC
  Administered 2015-05-09: 1.5 mg via TRANSDERMAL
  Filled 2015-05-09: qty 1

## 2015-05-09 MED ORDER — BISACODYL 5 MG PO TBEC: 5.0000 mg | DELAYED_RELEASE_TABLET | Freq: Every day | ORAL | Status: DC | PRN

## 2015-05-09 MED ORDER — BUPIVACAINE HCL 0.5 % IJ SOLN
INTRAMUSCULAR | Status: DC | PRN
  Administered 2015-05-09: 20 mL

## 2015-05-09 MED ORDER — FENTANYL CITRATE (PF) 250 MCG/5ML IJ SOLN
INTRAMUSCULAR | Status: AC
  Filled 2015-05-09: qty 5

## 2015-05-09 MED ORDER — ACETAMINOPHEN 650 MG RE SUPP: 650.0000 mg | RECTAL | Status: DC | PRN

## 2015-05-09 MED ORDER — OXYCODONE-ACETAMINOPHEN 5-325 MG PO TABS
1.0000 | ORAL_TABLET | ORAL | Status: DC | PRN
  Administered 2015-05-09 – 2015-05-10 (×3): 2 via ORAL
  Filled 2015-05-09 (×4): qty 2

## 2015-05-09 MED ORDER — PHENOL 1.4 % MT LIQD: 1.0000 | OROMUCOSAL | Status: DC | PRN

## 2015-05-09 MED ORDER — ONDANSETRON HCL 4 MG/2ML IJ SOLN
INTRAMUSCULAR | Status: DC | PRN
  Administered 2015-05-09 (×2): 4 mg via INTRAVENOUS

## 2015-05-09 MED ORDER — PROPOFOL 10 MG/ML IV BOLUS
INTRAVENOUS | Status: AC
  Filled 2015-05-09: qty 20

## 2015-05-09 MED ORDER — CEFAZOLIN SODIUM 1-5 GM-% IV SOLN
1.0000 g | Freq: Three times a day (TID) | INTRAVENOUS | Status: AC
  Administered 2015-05-09 – 2015-05-10 (×2): 1 g via INTRAVENOUS
  Filled 2015-05-09 (×2): qty 50

## 2015-05-09 MED ORDER — SODIUM CHLORIDE 0.9 % IJ SOLN
3.0000 mL | Freq: Two times a day (BID) | INTRAMUSCULAR | Status: DC
  Administered 2015-05-09: 3 mL via INTRAVENOUS

## 2015-05-09 MED ORDER — NEOSTIGMINE METHYLSULFATE 10 MG/10ML IV SOLN
INTRAVENOUS | Status: DC | PRN
  Administered 2015-05-09: 3 mg via INTRAVENOUS

## 2015-05-09 MED ORDER — HYDROMORPHONE HCL 1 MG/ML IJ SOLN: 0.5000 mg | INTRAMUSCULAR | Status: DC | PRN

## 2015-05-09 MED ORDER — PROPOFOL 500 MG/50ML IV EMUL
INTRAVENOUS | Status: DC | PRN
  Administered 2015-05-09: 10 ug/kg/min via INTRAVENOUS

## 2015-05-09 MED ORDER — LACTATED RINGERS IV SOLN
INTRAVENOUS | Status: DC
  Administered 2015-05-09: 11:00:00 via INTRAVENOUS

## 2015-05-09 MED ORDER — BUPIVACAINE HCL (PF) 0.5 % IJ SOLN
INTRAMUSCULAR | Status: AC
  Filled 2015-05-09: qty 30

## 2015-05-09 MED ORDER — PANTOPRAZOLE SODIUM 40 MG IV SOLR: 40.0000 mg | Freq: Every day | INTRAVENOUS | Status: DC

## 2015-05-09 MED ORDER — ACETAMINOPHEN 325 MG PO TABS: 650.0000 mg | ORAL_TABLET | ORAL | Status: DC | PRN

## 2015-05-09 MED ORDER — DROSPIRENONE-ETHINYL ESTRADIOL 3-0.02 MG PO TABS: 1.0000 | ORAL_TABLET | Freq: Every day | ORAL | Status: DC

## 2015-05-09 MED ORDER — MIDAZOLAM HCL 5 MG/5ML IJ SOLN
INTRAMUSCULAR | Status: DC | PRN
  Administered 2015-05-09: 2 mg via INTRAVENOUS

## 2015-05-09 MED ORDER — ZOLPIDEM TARTRATE 5 MG PO TABS: 5.0000 mg | ORAL_TABLET | Freq: Every evening | ORAL | Status: DC | PRN

## 2015-05-09 MED ORDER — FENTANYL CITRATE (PF) 100 MCG/2ML IJ SOLN
INTRAMUSCULAR | Status: DC | PRN
  Administered 2015-05-09: 100 ug via INTRAVENOUS
  Administered 2015-05-09: 50 ug via INTRAVENOUS
  Administered 2015-05-09: 100 ug via INTRAVENOUS
  Administered 2015-05-09: 50 ug via INTRAVENOUS
  Administered 2015-05-09: 100 ug via INTRAVENOUS
  Administered 2015-05-09 (×2): 50 ug via INTRAVENOUS

## 2015-05-09 MED ORDER — THROMBIN 5000 UNITS EX SOLR
CUTANEOUS | Status: AC
Start: 2015-05-09 — End: 2015-05-09
  Filled 2015-05-09: qty 5000

## 2015-05-09 MED ORDER — ALUM & MAG HYDROXIDE-SIMETH 200-200-20 MG/5ML PO SUSP: 30.0000 mL | Freq: Four times a day (QID) | ORAL | Status: DC | PRN

## 2015-05-09 MED ORDER — METHOCARBAMOL 1000 MG/10ML IJ SOLN: 500.0000 mg | Freq: Four times a day (QID) | INTRAVENOUS | Status: DC | PRN

## 2015-05-09 MED ORDER — DOCUSATE SODIUM 100 MG PO CAPS
100.0000 mg | ORAL_CAPSULE | Freq: Two times a day (BID) | ORAL | Status: DC
  Filled 2015-05-09 (×2): qty 1

## 2015-05-09 MED ORDER — ROCURONIUM BROMIDE 50 MG/5ML IV SOLN
INTRAVENOUS | Status: AC
  Filled 2015-05-09: qty 1

## 2015-05-09 MED ORDER — PROPOFOL 10 MG/ML IV BOLUS
INTRAVENOUS | Status: DC | PRN
  Administered 2015-05-09: 150 mg via INTRAVENOUS

## 2015-05-09 MED ORDER — PHENYLEPHRINE HCL 10 MG/ML IJ SOLN
20.0000 mg | INTRAVENOUS | Status: DC | PRN
  Administered 2015-05-09: 10 ug/min via INTRAVENOUS

## 2015-05-09 MED ORDER — POLYETHYLENE GLYCOL 3350 17 G PO PACK: 17.0000 g | PACK | Freq: Every day | ORAL | Status: DC | PRN

## 2015-05-09 MED ORDER — LISDEXAMFETAMINE DIMESYLATE 50 MG PO CAPS: 50.0000 mg | ORAL_CAPSULE | Freq: Every day | ORAL | Status: DC

## 2015-05-09 MED ORDER — MIDAZOLAM HCL 2 MG/2ML IJ SOLN
INTRAMUSCULAR | Status: AC
  Filled 2015-05-09: qty 2

## 2015-05-09 MED ORDER — HYDROMORPHONE HCL 1 MG/ML IJ SOLN: 0.2500 mg | INTRAMUSCULAR | Status: DC | PRN

## 2015-05-09 MED ORDER — LACTATED RINGERS IV SOLN
INTRAVENOUS | Status: DC
  Administered 2015-05-09: via INTRAVENOUS

## 2015-05-09 MED ORDER — BUPIVACAINE LIPOSOME 1.3 % IJ SUSP
20.0000 mL | INTRAMUSCULAR | Status: DC
  Filled 2015-05-09: qty 20

## 2015-05-09 MED ORDER — DEXAMETHASONE SODIUM PHOSPHATE 10 MG/ML IJ SOLN
INTRAMUSCULAR | Status: DC | PRN
  Administered 2015-05-09: 10 mg via INTRAVENOUS

## 2015-05-09 MED ORDER — SODIUM CHLORIDE 0.9 % IJ SOLN: 3.0000 mL | INTRAMUSCULAR | Status: DC | PRN

## 2015-05-09 MED ORDER — MENTHOL 3 MG MT LOZG
1.0000 | LOZENGE | OROMUCOSAL | Status: DC | PRN
  Administered 2015-05-09: 3 mg via ORAL
  Filled 2015-05-09: qty 9

## 2015-05-09 MED ORDER — GABAPENTIN 100 MG PO CAPS
100.0000 mg | ORAL_CAPSULE | Freq: Two times a day (BID) | ORAL | Status: DC
  Administered 2015-05-09 – 2015-05-10 (×2): 100 mg via ORAL
  Filled 2015-05-09 (×2): qty 1

## 2015-05-09 MED ORDER — VALACYCLOVIR HCL 500 MG PO TABS: 1000.0000 mg | ORAL_TABLET | Freq: Two times a day (BID) | ORAL | Status: DC

## 2015-05-09 MED ORDER — PANTOPRAZOLE SODIUM 40 MG PO TBEC
40.0000 mg | DELAYED_RELEASE_TABLET | Freq: Every day | ORAL | Status: DC
  Administered 2015-05-09: 40 mg via ORAL
  Filled 2015-05-09: qty 1

## 2015-05-09 MED ORDER — LACTATED RINGERS IV SOLN
INTRAVENOUS | Status: DC | PRN
  Administered 2015-05-09 (×2): via INTRAVENOUS

## 2015-05-09 MED ORDER — 0.9 % SODIUM CHLORIDE (POUR BTL) OPTIME
TOPICAL | Status: DC | PRN
  Administered 2015-05-09: 1000 mL

## 2015-05-09 MED ORDER — SODIUM CHLORIDE 0.9 % IV SOLN: 250.0000 mL | INTRAVENOUS | Status: DC

## 2015-05-09 MED ORDER — LIDOCAINE HCL (CARDIAC) 20 MG/ML IV SOLN
INTRAVENOUS | Status: AC
  Filled 2015-05-09: qty 5

## 2015-05-09 MED ORDER — ONDANSETRON HCL 4 MG/2ML IJ SOLN: 4.0000 mg | INTRAMUSCULAR | Status: DC | PRN

## 2015-05-09 MED ORDER — THROMBIN 20000 UNITS EX SOLR
CUTANEOUS | Status: DC | PRN
  Administered 2015-05-09: 20 mL via TOPICAL

## 2015-05-09 MED ORDER — OLOPATADINE HCL 0.1 % OP SOLN: 1.0000 [drp] | Freq: Two times a day (BID) | OPHTHALMIC | Status: DC

## 2015-05-09 MED ORDER — BUPIVACAINE LIPOSOME 1.3 % IJ SUSP
INTRAMUSCULAR | Status: DC | PRN
  Administered 2015-05-09: 20 mL

## 2015-05-09 MED ORDER — ROCURONIUM BROMIDE 100 MG/10ML IV SOLN
INTRAVENOUS | Status: DC | PRN
  Administered 2015-05-09: 10 mg via INTRAVENOUS
  Administered 2015-05-09: 50 mg via INTRAVENOUS

## 2015-05-09 MED ORDER — GLYCOPYRROLATE 0.2 MG/ML IJ SOLN
INTRAMUSCULAR | Status: AC
  Filled 2015-05-09: qty 2

## 2015-05-09 MED ORDER — METHOCARBAMOL 500 MG PO TABS: 500.0000 mg | ORAL_TABLET | Freq: Four times a day (QID) | ORAL | Status: DC | PRN

## 2015-05-09 MED ORDER — FLEET ENEMA 7-19 GM/118ML RE ENEM: 1.0000 | ENEMA | Freq: Once | RECTAL | Status: DC | PRN

## 2015-05-09 MED ORDER — GLYCOPYRROLATE 0.2 MG/ML IJ SOLN
INTRAMUSCULAR | Status: DC | PRN
  Administered 2015-05-09: .4 mg via INTRAVENOUS

## 2015-05-09 MED ORDER — METHOCARBAMOL 500 MG PO TABS: 500.0000 mg | ORAL_TABLET | Freq: Four times a day (QID) | ORAL | Status: DC

## 2015-05-09 MED ORDER — LIDOCAINE HCL (CARDIAC) 20 MG/ML IV SOLN
INTRAVENOUS | Status: DC | PRN
  Administered 2015-05-09: 60 mg via INTRAVENOUS

## 2015-05-09 MED ORDER — KETOROLAC TROMETHAMINE 30 MG/ML IJ SOLN: 30.0000 mg | Freq: Three times a day (TID) | INTRAMUSCULAR | Status: DC | PRN

## 2015-05-09 SURGICAL SUPPLY — 59 items
BIT DRILL NEURO 2X3.1 SFT TUCH (MISCELLANEOUS) IMPLANT
BLADE 11 SAFETY STRL DISP (BLADE) ×3 IMPLANT
BLADE SURG ROTATE 9660 (MISCELLANEOUS) IMPLANT
BUR RND FLUTED 2.5 (BURR) IMPLANT
COLLAR CERV LO CONTOUR FIRM DE (SOFTGOODS) ×3 IMPLANT
COVER SURGICAL LIGHT HANDLE (MISCELLANEOUS) ×3 IMPLANT
DERMABOND ADVANCED (GAUZE/BANDAGES/DRESSINGS) ×2
DERMABOND ADVANCED .7 DNX12 (GAUZE/BANDAGES/DRESSINGS) ×1 IMPLANT
DRAPE C-ARM 42X72 X-RAY (DRAPES) ×3 IMPLANT
DRAPE LAPAROTOMY T 102X78X121 (DRAPES) ×3 IMPLANT
DRAPE MICROSCOPE LEICA (MISCELLANEOUS) IMPLANT
DRAPE PROXIMA HALF (DRAPES) ×6 IMPLANT
DRAPE SURG 17X23 STRL (DRAPES) ×12 IMPLANT
DRILL NEURO 2X3.1 SOFT TOUCH (MISCELLANEOUS)
DRSG MEPILEX BORDER 4X4 (GAUZE/BANDAGES/DRESSINGS) ×3 IMPLANT
DRSG MEPILEX BORDER 4X8 (GAUZE/BANDAGES/DRESSINGS) IMPLANT
DRSG PAD ABDOMINAL 8X10 ST (GAUZE/BANDAGES/DRESSINGS) ×6 IMPLANT
DURAPREP 6ML APPLICATOR 50/CS (WOUND CARE) ×3 IMPLANT
ELECT CAUTERY BLADE 6.4 (BLADE) ×3 IMPLANT
ELECT REM PT RETURN 9FT ADLT (ELECTROSURGICAL) ×3
ELECTRODE REM PT RTRN 9FT ADLT (ELECTROSURGICAL) ×1 IMPLANT
EVACUATOR 1/8 PVC DRAIN (DRAIN) IMPLANT
GAUZE SPONGE 4X4 12PLY STRL (GAUZE/BANDAGES/DRESSINGS) ×3 IMPLANT
GLOVE BIO SURGEON STRL SZ 6.5 (GLOVE) ×2 IMPLANT
GLOVE BIO SURGEON STRL SZ7.5 (GLOVE) ×3 IMPLANT
GLOVE BIO SURGEONS STRL SZ 6.5 (GLOVE) ×1
GLOVE BIOGEL M 7.0 STRL (GLOVE) ×3 IMPLANT
GLOVE BIOGEL PI IND STRL 7.5 (GLOVE) ×1 IMPLANT
GLOVE BIOGEL PI IND STRL 8 (GLOVE) ×1 IMPLANT
GLOVE BIOGEL PI INDICATOR 7.5 (GLOVE) ×2
GLOVE BIOGEL PI INDICATOR 8 (GLOVE) ×2
GLOVE ECLIPSE 9.0 STRL (GLOVE) ×3 IMPLANT
GLOVE ORTHO TXT STRL SZ7.5 (GLOVE) ×3 IMPLANT
GLOVE SURG 8.5 LATEX PF (GLOVE) ×3 IMPLANT
GOWN STRL REUS W/ TWL LRG LVL3 (GOWN DISPOSABLE) ×1 IMPLANT
GOWN STRL REUS W/TWL 2XL LVL3 (GOWN DISPOSABLE) ×9 IMPLANT
GOWN STRL REUS W/TWL LRG LVL3 (GOWN DISPOSABLE) ×2
KIT BASIN OR (CUSTOM PROCEDURE TRAY) ×3 IMPLANT
KIT ROOM TURNOVER OR (KITS) ×3 IMPLANT
NEEDLE SPNL 18GX3.5 QUINCKE PK (NEEDLE) ×3 IMPLANT
NS IRRIG 1000ML POUR BTL (IV SOLUTION) ×3 IMPLANT
PACK ORTHO CERVICAL (CUSTOM PROCEDURE TRAY) ×3 IMPLANT
PAD ARMBOARD 7.5X6 YLW CONV (MISCELLANEOUS) ×6 IMPLANT
PATTIES SURGICAL .25X.25 (GAUZE/BANDAGES/DRESSINGS) ×3 IMPLANT
PATTIES SURGICAL .75X.75 (GAUZE/BANDAGES/DRESSINGS) IMPLANT
SPONGE SURGIFOAM ABS GEL 100 (HEMOSTASIS) ×3 IMPLANT
SUT ETHIBOND CT1 BRD #0 30IN (SUTURE) IMPLANT
SUT VIC AB 0 CT1 27 (SUTURE)
SUT VIC AB 0 CT1 27XBRD ANBCTR (SUTURE) IMPLANT
SUT VIC AB 2-0 CT1 27 (SUTURE)
SUT VIC AB 2-0 CT1 TAPERPNT 27 (SUTURE) IMPLANT
SUT VIC AB 2-0 UR6 27 (SUTURE) IMPLANT
SUT VIC AB 3-0 X1 27 (SUTURE) ×3 IMPLANT
SUT VICRYL 0 UR6 27IN ABS (SUTURE) ×3 IMPLANT
TAPE CLOTH 4X10 WHT NS (GAUZE/BANDAGES/DRESSINGS) ×3 IMPLANT
TOWEL OR 17X24 6PK STRL BLUE (TOWEL DISPOSABLE) ×3 IMPLANT
TOWEL OR 17X26 10 PK STRL BLUE (TOWEL DISPOSABLE) ×3 IMPLANT
TRAY FOLEY CATH 16FRSI W/METER (SET/KITS/TRAYS/PACK) IMPLANT
WATER STERILE IRR 1000ML POUR (IV SOLUTION) ×3 IMPLANT

## 2015-05-09 NOTE — Brief Op Note (Signed)
05/09/2015  2:53 PM  PATIENT:  Rachel French  39 y.o. 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 Sampson GoonFitzgerald EBL:  Total I/O In: 1600 [I.V.:1600] Out: -   BLOOD ADMINISTERED:none  DRAINS: none   LOCAL MEDICATIONS USED:  MARCAINE 0.5% 1:1 EXPAREL 1.3% Amount:10 ml  SPECIMEN:  No Specimen  DISPOSITION OF SPECIMEN:  N/A  COUNTS:  YES  TOURNIQUET:  * No tourniquets in log *  DICTATION: .Dragon Dictation  PLAN OF CARE: Admit for overnight observation  PATIENT DISPOSITION:  PACU - hemodynamically stable.   Delay start of Pharmacological VTE agent (>24hrs) due to surgical blood loss or risk of bleeding: yes

## 2015-05-09 NOTE — Anesthesia Postprocedure Evaluation (Signed)
Anesthesia Post Note  Patient: Rachel French  Procedure(s) Performed: Procedure(s) (LRB): POSTERIOR CERVICAL FORAMINOTOMY LEFT C6-7 WITH EXCISION OF HERNIATED NUCLEUS PULPOSUS (N/A)  Patient location during evaluation: PACU Anesthesia Type: General Level of consciousness: sedated Pain management: pain level controlled Vital Signs Assessment: post-procedure vital signs reviewed and stable Respiratory status: spontaneous breathing and respiratory function stable Cardiovascular status: stable Anesthetic complications: no    Last Vitals:  Filed Vitals:   05/09/15 1600 05/09/15 1615  BP: 101/75 106/72  Pulse: 60 63  Temp:    Resp: 15 14    Last Pain:  Filed Vitals:   05/09/15 1623  PainSc: 0-No pain                 [name] DANIEL

## 2015-05-09 NOTE — Anesthesia Procedure Notes (Signed)
Procedure Name: Intubation Date/Time: 05/09/2015 1:04 PM Performed by: Rachel French Pre-anesthesia Checklist: Patient identified, Emergency Drugs available, Suction available and Patient being monitored Patient Re-evaluated:Patient Re-evaluated prior to inductionOxygen Delivery Method: Circle system utilized Preoxygenation: Pre-oxygenation with 100% oxygen Intubation Type: IV induction Ventilation: Mask ventilation without difficulty Laryngoscope Size: Miller and 3 Grade View: Grade I Tube type: Oral Tube size: 7.0 mm Number of attempts: 1 Airway Equipment and Method: Stylet and LTA kit utilized Placement Confirmation: ETT inserted through vocal cords under direct vision,  positive ETCO2 and breath sounds checked- equal and bilateral Secured at: 20 cm Tube secured with: Tape Dental Injury: Teeth and Oropharynx as per pre-operative assessment

## 2015-05-09 NOTE — Anesthesia Preprocedure Evaluation (Signed)
Anesthesia Evaluation  Patient identified by MRN, date of birth, ID band Patient awake    Reviewed: Allergy & Precautions, H&P , NPO status , Patient's Chart, lab work & pertinent test results  History of Anesthesia Complications (+) PONV  Airway Mallampati: II  TM Distance: >3 FB Neck ROM: Full    Dental no notable dental hx. (+) Teeth Intact, Dental Advisory Given   Pulmonary neg pulmonary ROS,    Pulmonary exam normal breath sounds clear to auscultation       Cardiovascular negative cardio ROS   Rhythm:Regular Rate:Normal     Neuro/Psych Anxiety Bipolar Disorder negative neurological ROS     GI/Hepatic Neg liver ROS, PUD, Ulcerative Colitis   Endo/Other  negative endocrine ROS  Renal/GU negative Renal ROS  negative genitourinary   Musculoskeletal   Abdominal   Peds  Hematology negative hematology ROS (+)   Anesthesia Other Findings   Reproductive/Obstetrics negative OB ROS                             Anesthesia Physical Anesthesia Plan  ASA: II  Anesthesia Plan: General   Post-op Pain Management:    Induction: Intravenous  Airway Management Planned: Oral ETT  Additional Equipment:   Intra-op Plan:   Post-operative Plan: Extubation in OR  Informed Consent: I have reviewed the patients History and Physical, chart, labs and discussed the procedure including the risks, benefits and alternatives for the proposed anesthesia with the patient or authorized representative who has indicated his/her understanding and acceptance.   Dental advisory given  Plan Discussed with: CRNA  Anesthesia Plan Comments:         Anesthesia Quick Evaluation

## 2015-05-09 NOTE — Interval H&P Note (Signed)
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 After consideration of risks, benefits and other options for treatment, the patient has consented to  Procedure(s): POSTERIOR CERVICAL FORAMINOTOMY LEFT C6-7 WITH EXCISION OF HERNIATED NUCLEUS PULPOSUS (N/A) as a surgical intervention .  The patient's history has been reviewed, patient examined, no change in status, stable for surgery.  I have reviewed the patient's chart and labs.  Questions were answered to the patient's satisfaction.     [name]
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
## 2015-05-09 NOTE — Discharge Instructions (Signed)
No lifting greater than 10 lbs. °Avoid bending, stooping and twisting. °Walking in house for first week then may start to get out slowly increasing activity using arms. °Keep incision dry for 3 days, may use tegaderm or similar water impervious dressing. °Avoid overhead use of arms and overhead lifting. °Wear collar for comfort. °Use ice as needed for comfort. °

## 2015-05-09 NOTE — Op Note (Signed)
05/09/2015  2:55 PM  PATIENT:  Rachel French  39 y.o. 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 Ola Spurr.  EBL: 75cc  DRAINS: None    COMPLICATIONS:  None.   PROCEDURE:The patient was met in the holding area, and the appropriate left C6-7 cervical level identified and marked with "x" and my initials. All questions were answered and informed consent signed.   The patient was then transported to OR. The patient was then placed under general anesthesia without difficulty. A foley catheter was placed sterilely by OR nursing personnel. and transferred to the operating room table prone position [place]field horseshoe with Chest Rolls. All pressure points well-padded PAS stockings.Shoulders taped down and skin over the posterior inferior aspect of the neck place in traction to decrease skin folds. The patient received appropriate preoperative antibiotic 2 grams ancef. prophylaxis.Time-out procedure was called and correct.   Sterile prep with DuraPrep and draped in the usual manner the shoulders were taped downwards and skin traction over the skin of the neck. Following DuraPrep draped in the usual manner. After timeout protocol incision was made approximately C6-7 in the midline. This following infiltration of skin and subcutaneous layers with marcaine 0.5% 1:1 exparel 1.3% total of 10 cc. Incision carried through skin and subcutaneous layers using 10 blade scalpel and  electrocautery down to the level ligamentum nuchae. Incision made along the lateral aspect of the spinous process of C6. Clamp then placed at the spinous process of C6. Intraoperative C-arm fluoroscopy identified the clamp at the C6 spinous process level. Then a marking pen was used to mark the spinous process of C6. Electrocautery then used to carefully incise the cervical muscles off the left lateral aspect of the spinous process of C6-7. Dividing the  spinous muscles off of the inferior aspect lamina at C6 exposing the C6-7 posterior aspect of the interlaminar space. The magnification headlamp were used during this portion procedure. Boss McCollough retractor was inserted. High-speed bur was used to remove a small portion of bone from the inferior aspect of lamina of C6 and the medial 20% of the inferior articular process of C6. Further thinning the superior aspect of the lamina right C7. A 1 mm Kerrison was then used to remove him from superior aspect of the lamina C7 and the medial aspect of the intra-articular process of C6 the 20%, exposing the superior articular process of C7. A 1 mm Kerrison was removed bone off the medial aspect of the superior articular process of C7 resecting 20% of the medial aspect of the superior articular process of C7. Ligamentum flavum then easily lifted superiorly  electrocautery unit cauterizing epidural veins deep to the ligamentum flavum  then resecting the ligamentum flavum. The operating room microscope was draped sterilely and brought into the field. Under the operating room microscope the epidural vein layer overlying the posterior aspect of the thecal sac and the C7 nerve root was then carefully lifted using a micro-titanium nerve hook then cauterized using bipolar electrocautery a #  15 blade scalpel then used to incise this overlying the C7 nerve root releasing the vascular leash a backward angle 3-0 microcurette then used to remove a small portion of bone off the  superior and medial aspect of the pedicle further mobilizing the C7 nerve root bipolar electrocautery to control all bleeding within the axillary area of the C7 nerve. Bone wax was applied to bleeding cancellus bone surfaces are excellent hemostasis obtained  The disc explored using a Penfield 4 found to be protruded. 11 blade scalpel used to incise the disc mild disc material found to be herniated and uncovertebral joint found to be prominent resulting in right C7 foramenal narrowing. Following this then hemostasis was obtained using thrombin-soaked Gelfoam and micro-pledgettes. When complete hemostasis was obtained all gel foam was removed the nerve hook could be easily passed out the neuroforamen without the lateral aspect of the C7 pedicle demonstrate the C7 neuroforamen completely decompressed. Irrigation was carried out no active bleeding was present. The incision was closed by approximating the ligamentum nuchae with #1 ethibond sutures. The subcutaneous layers approximated with interrupted 0 Vicryl suture more superficial layers with interrupted 2-0 Vicryl sutures and the skin closed with interrupted 4-0 Vicryl sutures. Dermabond was applied then MedPlex bandage. Soft cervical collar placed.  All instrument and sponge counts were correct. Patient was then returned to supine position on her stretcher. Returned to recovery room in satisfactory condition.   Physician assistant's responsibilities: Benjiman Core, PA-C perform the duties of assistant physician and surgeon during this case present from the beginning of the case to the end of the case. He assisted with careful retraction of neural structures suctioning about her elements including cervical cord and C7 nerve root. Performed closure of the incision on the ligamentum nuchae to the skin and application of dressing. He assisted in positioning the patient had removal the patient from the OR table to the stretcher.     Cletus Paris E 05/09/2015, 2:55  PM

## 2015-05-09 NOTE — Transfer of Care (Signed)
Immediate Anesthesia Transfer of Care Note  Patient: Rachel French  Procedure(s) Performed: Procedure(s): POSTERIOR CERVICAL FORAMINOTOMY LEFT C6-7 WITH EXCISION OF HERNIATED NUCLEUS PULPOSUS (N/A)  Patient Location: PACU  Anesthesia Type:General  Level of Consciousness: awake, alert , oriented and patient cooperative  Airway & Oxygen Therapy: Patient Spontanous Breathing and Patient connected to nasal cannula oxygen  Post-op Assessment: Report given to RN, Post -op Vital signs reviewed and stable and Patient moving all extremities  Post vital signs: Reviewed and stable  Last Vitals:  Filed Vitals:   05/09/15 1038 05/09/15 1529  BP: 117/76   Pulse: 78   Temp: 36.4 C 36.6 C  Resp: 18     Complications: No apparent anesthesia complications

## 2015-05-10 DIAGNOSIS — M50223 Other cervical disc displacement at C6-C7 level: Secondary | ICD-10-CM | POA: Diagnosis not present

## 2015-05-10 DIAGNOSIS — M47812 Spondylosis without myelopathy or radiculopathy, cervical region: Secondary | ICD-10-CM | POA: Diagnosis present

## 2015-05-10 NOTE — Progress Notes (Signed)
Patient discharged home with family. Discharge instructions and prescriptions given.

## 2015-05-10 NOTE — Progress Notes (Signed)
PT Cancellation and Discharge Note  Patient Details Name: Rachel French MRN: [id number] DOB: 05-15-1976   Cancelled Treatment:    Reason Eval/Treat Not Completed: PT screened, no needs identified, will sign off  Patient functioning at a high level of independence. Performed high level dynamic gait tasks without loss of balance. Navigates steps without rails safely. Verbalizes understanding of precautions. PT screening out with no acute PT needs. All questions have been answered and pt feels confident with her abilities. Thank you for this referral.  Berton MountBarbour, Alenah Sarria S 05/10/2015, 9:05 AM Charlsie MerlesLogan Secor Nazaret Chea, PT [phone]

## 2015-05-10 NOTE — Discharge Summary (Signed)
Physician Discharge Summary  Patient ID: Rachel French MRN: [id number] DOB/AGE: 10/22/1975 39 y.o.  Admit date: 05/09/2015 Discharge date: 05/10/2015  Admission Diagnoses: Degenerative disc disease cervical spine  Discharge Diagnoses:  Principal Problem:   Herniated cervical disc Active Problems:   Herniation of cervical intervertebral disc with radiculopathy   Cervical spine degeneration   Discharged Condition: stable  Hospital Course: Patient's hospital course was essentially unremarkable she underwent decompression of a cervical herniated disc and was discharged to home in stable condition.  Consults: None  Significant Diagnostic Studies: labs: Tina labs  Treatments: surgery: See operative note  Discharge Exam: Blood pressure 104/67, pulse 99, temperature 97.8 F (36.6 C), temperature source Oral, resp. rate 16, height 5\' 2"  (1.575 m), weight 78.608 kg (173 lb 4.8 oz), SpO2 98 %. Incision/Wound: dressing clean and dry  Disposition:   Discharge Instructions    Call MD / Call 911    Complete by:  As directed   If you experience chest pain or shortness of breath, CALL 911 and be transported to the hospital emergency room.  If you develope a fever above 101 F, pus (white drainage) or increased drainage or redness at the wound, or calf pain, call your surgeon's office.     Call MD / Call 911    Complete by:  As directed   If you experience chest pain or shortness of breath, CALL 911 and be transported to the hospital emergency room.  If you develope a fever above 101 F, pus (white drainage) or increased drainage or redness at the wound, or calf pain, call your surgeon's office.     Constipation Prevention    Complete by:  As directed   Drink plenty of fluids.  Prune juice may be helpful.  You may use a stool softener, such as Colace (over the counter) 100 mg twice a day.  Use MiraLax (over the counter) for constipation as needed.     Constipation Prevention    Complete by:  As  directed   Drink plenty of fluids.  Prune juice may be helpful.  You may use a stool softener, such as Colace (over the counter) 100 mg twice a day.  Use MiraLax (over the counter) for constipation as needed.     Diet - low sodium heart healthy    Complete by:  As directed      Diet - low sodium heart healthy    Complete by:  As directed      Discharge instructions    Complete by:  As directed   No lifting greater than 10 lbs. Avoid bending, stooping and twisting. Walking in house for first week then may start to get out slowly increasing activity using arms. Keep incision dry for 3 days, may use tegaderm or similar water impervious dressing. Avoid overhead use of arms and overhead lifting. Wear collar for comfort. Use ice as needed for comfort.     Driving restrictions    Complete by:  As directed   No driving for 3 weeks     Increase activity slowly as tolerated    Complete by:  As directed      Increase activity slowly as tolerated    Complete by:  As directed      Lifting restrictions    Complete by:  As directed   No lifting for 6 weeks            Medication List    TAKE these medications  drospirenone-ethinyl estradiol 3-0.02 MG tablet  Commonly known as:  VESTURA  Take 1 tablet by mouth daily.     gabapentin 100 MG capsule  Commonly known as:  NEURONTIN  Take 1-3 capsules at bedtime     lisdexamfetamine 50 MG capsule  Commonly known as:  VYVANSE  Take 1 capsule (50 mg total) by mouth daily.     methocarbamol 500 MG tablet  Commonly known as:  ROBAXIN  Take 1 tablet (500 mg total) by mouth 4 (four) times daily.     oxyCODONE-acetaminophen 5-325 MG tablet  Commonly known as:  ROXICET  Take 1-2 tablets by mouth every 6 (six) hours as needed for severe pain.     PATADAY 0.2 % Soln  Generic drug:  Olopatadine HCl  Place 1 drop into both eyes daily as needed (allergies).     valACYclovir 1000 MG tablet  Commonly known as:  VALTREX  Take 1 tablet  (1,000 mg total) by mouth 2 (two) times daily.           Follow-up Information    Follow up with NITKA,JAMES E, MD In 2 weeks.   Specialty:  Orthopedic Surgery   Why:  For wound re-check   Contact information:   [address] [place] [address] [phone]       Signed: Nadara Mustard 05/10/2015, 7:37 AM

## 2015-05-10 NOTE — Progress Notes (Signed)
Occupational Therapy Evaluation Patient Details Name: Rachel French MRN: [id number] DOB: 30-Sep-1975 Today's Date: 05/10/2015    History of Present Illness s/p POSTERIOR CERVICAL FORAMINOTOMY LEFT C6-7 WITH EXCISION OF HERNIATED NUCLEUS PULPOSUS   Clinical Impression   Pt admitted with the above diagnoses and presents with below problem list. PTA pt was independent with ADLs. Pt is setup to supervision with most ADLs. Spouse and daughter assisting at home. ADL education completed and cervical precaution handout given and reviewed. No further acute OT needs indicated. OT signing off.    Follow Up Recommendations  No OT follow up    Equipment Recommendations  None recommended by OT    Recommendations for Other Services       Precautions / Restrictions Precautions Precautions: Cervical Required Braces or Orthoses: Cervical Brace Cervical Brace: Soft collar      Mobility Bed Mobility Overal bed mobility: Modified Independent             General bed mobility comments: educated on logrolling technique with pt completing 2x  Transfers Overall transfer level: Modified independent Equipment used: None                  Balance Overall balance assessment: No apparent balance deficits (not formally assessed)                                          ADL Overall ADL's : Needs assistance/impaired Eating/Feeding: Set up;Sitting   Grooming: Supervision/safety;Standing   Upper Body Bathing: Set up;Sitting;Minimal assitance Upper Body Bathing Details (indicate cue type and reason): min A for hairwashing Lower Body Bathing: Supervison/ safety;Sit to/from stand   Upper Body Dressing : Set up;Sitting   Lower Body Dressing: Supervision/safety;Sit to/from stand   Toilet Transfer: Supervision/safety;Ambulation;Comfort height toilet   Toileting- Clothing Manipulation and Hygiene: Supervision/safety   Tub/ Shower Transfer: Tub transfer;Min  guard;Ambulation   Functional mobility during ADLs: Supervision/safety General ADL Comments: ADL education and cervical education handout provided.      Vision     Perception     Praxis      Pertinent Vitals/Pain Pain Assessment: Faces Faces Pain Scale: Hurts a little bit Pain Location: neck Pain Intervention(s): Monitored during session;Repositioned     Hand Dominance Right   Extremity/Trunk Assessment Upper Extremity Assessment Upper Extremity Assessment: Overall WFL for tasks assessed   Lower Extremity Assessment Lower Extremity Assessment: Overall WFL for tasks assessed       Communication Communication Communication: No difficulties   Cognition Arousal/Alertness: Awake/alert Behavior During Therapy: WFL for tasks assessed/performed Overall Cognitive Status: Within Functional Limits for tasks assessed                     General Comments       Exercises       Shoulder Instructions      Home Living Family/patient expects to be discharged to:: Private residence Living Arrangements: Spouse/significant other;Children Available Help at Discharge: Family Type of Home: House Home Access: Stairs to enter     Home Layout: One level     Bathroom Shower/Tub: Tub/shower unit         Home Equipment: None   Additional Comments: Pt lives with spouse and 77 yo daughter.      Prior Functioning/Environment Level of Independence: Independent             OT Diagnosis: Acute pain  OT Problem List:     OT Treatment/Interventions:      OT Goals(Current goals can be found in the care plan section)    OT Frequency:     Barriers to D/C:            Co-evaluation              End of Session Equipment Utilized During Treatment: Cervical collar  Activity Tolerance: Patient tolerated treatment well Patient left: in bed;with call bell/phone within reach   Time: 0905-0924 OT Time Calculation (min): 19 min Charges:  OT General  Charges $OT Visit: 1 Procedure OT Evaluation $Initial OT Evaluation Tier I: 1 Procedure G-Codes: OT G-codes **NOT FOR INPATIENT CLASS** Functional Assessment Tool Used: clincial judgement Functional Limitation: Self care Self Care Current Status (Z3086(G8987): At least 1 percent but less than 20 percent impaired, limited or restricted Self Care Goal Status ([id number](G8988): At least 1 percent but less than 20 percent impaired, limited or restricted Self Care Discharge Status [phone]): At least 1 percent but less than 20 percent impaired, limited or restricted  Pilar GrammesMathews, [name] 05/10/2015, 9:33 AM

## 2015-05-12 ENCOUNTER — Encounter (HOSPITAL_COMMUNITY): Payer: Self-pay | Admitting: Specialist

## 2015-07-24 ENCOUNTER — Other Ambulatory Visit: Payer: Self-pay | Admitting: Specialist

## 2015-07-24 DIAGNOSIS — M25521 Pain in right elbow: Secondary | ICD-10-CM

## 2015-07-24 DIAGNOSIS — G8929 Other chronic pain: Secondary | ICD-10-CM

## 2015-07-24 DIAGNOSIS — M79601 Pain in right arm: Secondary | ICD-10-CM

## 2015-07-24 DIAGNOSIS — M542 Cervicalgia: Secondary | ICD-10-CM

## 2015-07-30 ENCOUNTER — Ambulatory Visit
Admission: RE | Admit: 2015-07-30 | Discharge: 2015-07-30 | Disposition: A | Discharge status: Home / Self Care | Payer: BC Managed Care – PPO | Source: Ambulatory Visit | Attending: Specialist | Admitting: Specialist

## 2015-07-30 DIAGNOSIS — M25521 Pain in right elbow: Secondary | ICD-10-CM

## 2015-07-30 DIAGNOSIS — M542 Cervicalgia: Secondary | ICD-10-CM

## 2015-07-30 DIAGNOSIS — G8929 Other chronic pain: Secondary | ICD-10-CM

## 2015-07-30 DIAGNOSIS — M79601 Pain in right arm: Secondary | ICD-10-CM

## 2015-09-16 ENCOUNTER — Other Ambulatory Visit: Payer: Self-pay | Admitting: Gynecology

## 2015-09-19 ENCOUNTER — Encounter: Payer: BC Managed Care – PPO | Admitting: Gynecology

## 2015-10-10 ENCOUNTER — Other Ambulatory Visit: Payer: Self-pay | Admitting: Gynecology

## 2015-10-15 ENCOUNTER — Telehealth: Payer: Self-pay | Admitting: Family Medicine

## 2015-10-15 NOTE — Telephone Encounter (Signed)
Ok to refill??  Last office visit/ refill 04/20/2016, #3 printed prescriptions.

## 2015-10-15 NOTE — Telephone Encounter (Signed)
Patient is calling to get rx for her vyvanse  [phone]

## 2015-10-15 NOTE — Telephone Encounter (Signed)
Okay 

## 2015-10-16 MED ORDER — LISDEXAMFETAMINE DIMESYLATE 50 MG PO CAPS
50.0000 mg | ORAL_CAPSULE | Freq: Every day | ORAL | Status: DC
Start: 2015-10-16 — End: 2015-10-16

## 2015-10-16 MED ORDER — LISDEXAMFETAMINE DIMESYLATE 50 MG PO CAPS: 50.0000 mg | ORAL_CAPSULE | Freq: Every day | ORAL | Status: DC

## 2015-10-16 NOTE — Telephone Encounter (Signed)
Prescription printed x3 and patient made aware to come to office to pick up on 10/17/2015 after 10am.

## 2015-11-06 ENCOUNTER — Encounter: Payer: Self-pay | Admitting: Gynecology

## 2015-11-06 ENCOUNTER — Ambulatory Visit (INDEPENDENT_AMBULATORY_CARE_PROVIDER_SITE_OTHER): Payer: BC Managed Care – PPO | Admitting: Gynecology

## 2015-11-06 VITALS — BP 124/78 | Ht 62.0 in | Wt 172.0 lb

## 2015-11-06 DIAGNOSIS — Z01419 Encounter for gynecological examination (general) (routine) without abnormal findings: Secondary | ICD-10-CM | POA: Diagnosis not present

## 2015-11-06 DIAGNOSIS — L237 Allergic contact dermatitis due to plants, except food: Secondary | ICD-10-CM | POA: Diagnosis not present

## 2015-11-06 MED ORDER — TRIAMCINOLONE ACETONIDE 0.5 % EX CREA: 1.0000 "application " | TOPICAL_CREAM | Freq: Three times a day (TID) | CUTANEOUS | Status: DC

## 2015-11-06 MED ORDER — DROSPIRENONE-ETHINYL ESTRADIOL 3-0.02 MG PO TABS: 1.0000 | ORAL_TABLET | Freq: Every day | ORAL | Status: DC

## 2015-11-06 NOTE — Progress Notes (Signed)
    Rachel French 09-10-1975 [id number]        40 y.o.  G2P0011  for annual exam.  Doing well.  Past medical history,surgical history, problem list, medications, allergies, family history and social history were all reviewed and documented as reviewed in the EPIC chart.  ROS:  Performed with pertinent positives and negatives included in the history, assessment and plan.   Additional significant findings :  Poison ivy as noted below   Exam: Rachel PortelaKim French assistant Filed Vitals:   11/06/15 1157  BP: 124/78  Height: 5\' 2"  (1.575 m)  Weight: 172 lb (78.019 kg)   General appearance:  Normal affect, orientation and appearance. Skin: Grossly normal excepting several patches of poison ivy on both upper extremities HEENT: Without gross lesions.  No cervical or supraclavicular adenopathy. Thyroid normal.  Lungs:  Clear without wheezing, rales or rhonchi Cardiac: RR, without RMG Abdominal:  Soft, nontender, without masses, guarding, rebound, organomegaly or hernia Breasts:  Examined lying and sitting without masses, retractions, discharge or axillary adenopathy. Pelvic:  Ext/BUS/vagina normal  Cervix normal. Pap smear done  Uterus anteverted, normal size, shape and contour, midline and mobile nontender   Adnexa without masses or tenderness    Anus and perineum normal   Rectovaginal normal sphincter tone without palpated masses or tenderness.    Assessment/Plan:  40 y.o. 452P0011 female for annual exam with regular menses, oral contraceptives.   1. [name] equivalent birth control pills. Doing well once to continue.  I reviewed the increased risk of thrombosis associated with these. She understands and accepts. Never smoked and not being followed for any medical issues. Refill 1 year provided. 2. Poison ivy. Triamcinolone 0.5% cream prescribed to apply 3 times daily as needed. 3. History of LEEP 1997. 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 Cytology was  normal.  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 Followup colposcopy biopsy consistent with LGSIL in the biopsy and ECC. Pap smear/HPV 2015 normal. Pap smear 2016 normal. Pap smear done today. If normal then plan less frequent screening intervals per current screening guidelines. 4. Breast health. SBE monthly reviewed. Patient plans mammogram next year when she turns 41. 5. Health maintenance. No routine blood work done as patient recently had done through her orthopedics office. Her lipid profile last year was normal. Follow up in one year, sooner as needed.    Dara LordsFONTAINE,Rachel Shippee P MD, 12:24 PM 11/06/2015

## 2015-11-06 NOTE — Addendum Note (Signed)
Addended by: Dayna BarkerGARDNER, Tonica Brasington K on: 11/06/2015 12:44 PM   Modules accepted: Orders, SmartSet

## 2015-11-06 NOTE — Patient Instructions (Signed)
Apply the steroid cream to the poison ivy as needed 3 times daily

## 2015-11-07 LAB — PAP IG W/ RFLX HPV ASCU

## 2015-12-05 ENCOUNTER — Other Ambulatory Visit: Payer: Self-pay | Admitting: Gynecology

## 2015-12-05 ENCOUNTER — Telehealth: Payer: Self-pay

## 2015-12-05 DIAGNOSIS — Z139 Encounter for screening, unspecified: Secondary | ICD-10-CM

## 2015-12-05 NOTE — Telephone Encounter (Signed)
Patient saw you 11/06/15 for CE but said her health history changed yesterday as her Mom was diagnosed with breast cancer. She questioned should she go ahead and have mammogram now?  Feels like she would like to with this news.

## 2015-12-05 NOTE — Telephone Encounter (Signed)
Mammogram now is okay. She certainly is within the screening guidelines to go ahead and do so

## 2015-12-05 NOTE — Telephone Encounter (Signed)
Patient informed. I called [facility]. No order needed. Patient can just call to schedule.

## 2015-12-12 ENCOUNTER — Ambulatory Visit
Admission: RE | Admit: 2015-12-12 | Discharge: 2015-12-12 | Disposition: A | Discharge status: Home / Self Care | Payer: BC Managed Care – PPO | Source: Ambulatory Visit | Attending: Gynecology | Admitting: Gynecology

## 2015-12-12 DIAGNOSIS — Z139 Encounter for screening, unspecified: Secondary | ICD-10-CM

## 2016-01-23 ENCOUNTER — Telehealth: Payer: Self-pay | Admitting: *Deleted

## 2016-01-23 MED ORDER — LISDEXAMFETAMINE DIMESYLATE 50 MG PO CAPS: 50.0000 mg | ORAL_CAPSULE | Freq: Every day | ORAL | 0 refills | Status: DC

## 2016-01-23 NOTE — Telephone Encounter (Signed)
Received call from patient.   Requested refill on Vyvanze x3.    Ok to refill??  Last office visit 04/20/2016.  Last refill 10/15/2015, #3 prescriptions.

## 2016-01-23 NOTE — Telephone Encounter (Signed)
Okay to refill? 

## 2016-01-23 NOTE — Telephone Encounter (Signed)
Prescription printed x3 and patient made aware to come to office to pick up on 01/26/2016 per VM.

## 2016-01-28 ENCOUNTER — Telehealth: Payer: Self-pay | Admitting: *Deleted

## 2016-01-28 NOTE — Telephone Encounter (Signed)
Received request from pharmacy for PA on Vyvanse  PA submitted.   Dx: F90.9- ADHD 

## 2016-01-29 NOTE — Telephone Encounter (Signed)
Received PA determination.   PA approved 01/28/2016- 01/28/2019.  Pharmacy made aware.  

## 2016-02-25 ENCOUNTER — Other Ambulatory Visit: Payer: Self-pay | Admitting: Gynecology

## 2016-03-30 ENCOUNTER — Telehealth (INDEPENDENT_AMBULATORY_CARE_PROVIDER_SITE_OTHER): Payer: Self-pay | Admitting: *Deleted

## 2016-03-30 NOTE — Telephone Encounter (Signed)
Patient called and states she has some questions about her disability form that was dropped off at our office. Patient is requesting a call back. Her number [phone]. Thank you

## 2016-03-31 NOTE — Telephone Encounter (Signed)
Called and spoke with patient. She states that her work will not accommodate her on light duty. States she needs us to mark her disability form as permanent disability. States she has nerve damage and is still having a lot of pain and numbness in her hands. Please advise so I can make corrections to form. Thanks.

## 2016-03-31 NOTE — Telephone Encounter (Signed)
I recommend that she try to return to work and she is not totally disabled.jen

## 2016-04-02 NOTE — Telephone Encounter (Signed)
Called patient and advised of Dr. Barbaraann FasterNitka's message below. Thanks. Herbert SetaHeather

## 2016-04-15 ENCOUNTER — Telehealth (INDEPENDENT_AMBULATORY_CARE_PROVIDER_SITE_OTHER): Payer: Self-pay | Admitting: *Deleted

## 2016-04-15 NOTE — Telephone Encounter (Signed)
Pt. Called about disability forms and pt is requesting call back. CB:[phone]

## 2016-04-16 NOTE — Telephone Encounter (Signed)
Called and discussed from with patient Thanks. Herbert SetaHeather

## 2016-05-12 ENCOUNTER — Ambulatory Visit (INDEPENDENT_AMBULATORY_CARE_PROVIDER_SITE_OTHER): Payer: BC Managed Care – PPO | Admitting: Family

## 2016-06-02 ENCOUNTER — Telehealth: Payer: Self-pay | Admitting: *Deleted

## 2016-06-02 NOTE — Telephone Encounter (Signed)
NTBS as it has been > 1 year since LOV

## 2016-06-02 NOTE — Telephone Encounter (Signed)
Received call from patient.   Requested refill on Vyvanse x3.   Ok to refill??  Last office visit 04/21/2015.  Last refill 01/23/2016, x3 printed prescriptions.

## 2016-06-02 NOTE — Telephone Encounter (Signed)
Appointment scheduled with PCP on 06/09/2016.

## 2016-06-09 ENCOUNTER — Ambulatory Visit (INDEPENDENT_AMBULATORY_CARE_PROVIDER_SITE_OTHER): Payer: Medicaid Other | Admitting: Family Medicine

## 2016-06-09 ENCOUNTER — Encounter: Payer: Self-pay | Admitting: Family Medicine

## 2016-06-09 VITALS — BP 126/70 | HR 80 | Temp 98.8°F | Resp 18 | Ht 62.0 in | Wt 185.0 lb

## 2016-06-09 DIAGNOSIS — F902 Attention-deficit hyperactivity disorder, combined type: Secondary | ICD-10-CM | POA: Diagnosis not present

## 2016-06-09 DIAGNOSIS — F418 Other specified anxiety disorders: Secondary | ICD-10-CM

## 2016-06-09 MED ORDER — VALACYCLOVIR HCL 1 G PO TABS: 1000.0000 mg | ORAL_TABLET | Freq: Two times a day (BID) | ORAL | 1 refills | Status: DC | PRN

## 2016-06-09 MED ORDER — GABAPENTIN 100 MG PO CAPS: ORAL_CAPSULE | ORAL | 3 refills | Status: DC

## 2016-06-09 MED ORDER — LISDEXAMFETAMINE DIMESYLATE 50 MG PO CAPS
50.0000 mg | ORAL_CAPSULE | Freq: Every day | ORAL | 0 refills | Status: DC
Start: 2016-06-09 — End: 2016-07-07

## 2016-06-09 MED ORDER — FLUOXETINE HCL 20 MG PO TABS: 20.0000 mg | ORAL_TABLET | Freq: Every day | ORAL | 3 refills | Status: DC

## 2016-06-09 MED ORDER — LISDEXAMFETAMINE DIMESYLATE 50 MG PO CAPS: 50.0000 mg | ORAL_CAPSULE | Freq: Every day | ORAL | 0 refills | Status: DC
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
## 2016-06-09 NOTE — Patient Instructions (Signed)
Start the prozac Continue all other medications F/U 4 weeks

## 2016-06-10 NOTE — Assessment & Plan Note (Signed)
Continue vyvnase

## 2016-06-10 NOTE — Assessment & Plan Note (Signed)
Will try low dose prozac, if this swings her into manic state will need definite mood stabilizer and psychiatry f/u 4 weeks

## 2016-06-11 ENCOUNTER — Telehealth: Payer: Self-pay | Admitting: *Deleted

## 2016-06-11 MED ORDER — FLUOXETINE HCL 20 MG PO CAPS: 20.0000 mg | ORAL_CAPSULE | Freq: Every day | ORAL | 3 refills | Status: DC

## 2016-06-11 NOTE — Telephone Encounter (Signed)
Received fax requesting new prescription for Prozac 20mg  Caps.   Advised that insurance does not cover tabs.   Prescription sent to pharmacy.

## 2016-07-07 ENCOUNTER — Ambulatory Visit (INDEPENDENT_AMBULATORY_CARE_PROVIDER_SITE_OTHER): Payer: Medicaid Other | Admitting: Family Medicine

## 2016-07-07 ENCOUNTER — Encounter: Payer: Self-pay | Admitting: Family Medicine

## 2016-07-07 VITALS — BP 126/76 | HR 78 | Temp 98.7°F | Resp 14 | Ht 62.0 in | Wt 183.0 lb

## 2016-07-07 DIAGNOSIS — F418 Other specified anxiety disorders: Secondary | ICD-10-CM | POA: Diagnosis not present

## 2016-07-07 MED ORDER — LISDEXAMFETAMINE DIMESYLATE 50 MG PO CAPS: 50.0000 mg | ORAL_CAPSULE | Freq: Every day | ORAL | 0 refills | Status: DC

## 2016-07-07 MED ORDER — FLUOXETINE HCL 20 MG PO CAPS: 20.0000 mg | ORAL_CAPSULE | Freq: Every day | ORAL | 3 refills | Status: DC

## 2016-07-07 NOTE — Assessment & Plan Note (Signed)
Improvement in mood, continue prozac 20mg  once a day  Continue vyvanase for ADHD given script for March  Recheck in 3months

## 2016-07-07 NOTE — Progress Notes (Signed)
   Subjective:    Patient ID: Julian HyKelly Stamos, female    DOB: [birth year], 41 y.o.   MRN: [id number]  Patient presents for 4 week F/U (is fasting) Here for interim follow-up on depression with anxiety. Her last visit she expressed some depressed mood and dealing with some family stressors assessed with her teenage daughter who is recently diagnosed with bipolar. 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 injury. She started on Prozac here to follow-up on that. She is also still on her ADHD medication. Vyvanse  That she feels much better on the Prozac she does not get anxious and overwhelmed and feels much,. She still dealing with her daughter who seen a psychiatrist for her bipolar and had some recent cutting. Her ex-husband is also trying to come back into the picture to help with their daughter. She's not had any side effects from the medications. No new concerns today.    Review Of Systems:  GEN- denies fatigue, fever, weight loss,weakness, recent illness HEENT- denies eye drainage, change in vision, nasal discharge, CVS- denies chest pain, palpitations RESP- denies SOB, cough, wheeze ABD- denies N/V, change in stools, abd pain Neuro- denies headache, dizziness, syncope, seizure activity       Objective:    BP 126/76   Pulse 78   Temp 98.7 F (37.1 C) (Oral)   Resp 14   Ht 5\' 2"  (1.575 m)   Wt 183 lb (83 kg)   SpO2 99%   BMI 33.47 kg/m  GEN- NAD, alert and oriented x3 Psych- normal affect and mood,but more calmer. Normal speech, normal thought process        Assessment & Plan:      Problem List Items Addressed This Visit    Depression with anxiety - Primary    Improvement in mood, continue prozac 20mg  once a day  Continue vyvanase for ADHD given script for March  Recheck in 3months          Note: This dictation was prepared with Dragon dictation along with smaller phrase technology. Any transcriptional errors that result from this process are  unintentional.

## 2016-07-07 NOTE — Patient Instructions (Signed)
F/U  3 months 

## 2016-08-02 ENCOUNTER — Ambulatory Visit (INDEPENDENT_AMBULATORY_CARE_PROVIDER_SITE_OTHER): Payer: Medicaid Other | Admitting: Surgery

## 2016-08-02 ENCOUNTER — Ambulatory Visit (INDEPENDENT_AMBULATORY_CARE_PROVIDER_SITE_OTHER): Payer: Medicaid Other

## 2016-08-02 ENCOUNTER — Encounter (INDEPENDENT_AMBULATORY_CARE_PROVIDER_SITE_OTHER): Payer: Self-pay | Admitting: Surgery

## 2016-08-02 VITALS — BP 121/77 | HR 88 | Ht 62.0 in | Wt 186.0 lb

## 2016-08-02 DIAGNOSIS — M545 Low back pain: Secondary | ICD-10-CM

## 2016-08-02 DIAGNOSIS — M4317 Spondylolisthesis, lumbosacral region: Secondary | ICD-10-CM

## 2016-08-02 MED ORDER — METHOCARBAMOL 500 MG PO TABS: 500.0000 mg | ORAL_TABLET | Freq: Three times a day (TID) | ORAL | 0 refills | Status: DC | PRN

## 2016-08-02 MED ORDER — HYDROCODONE-ACETAMINOPHEN 5-325 MG PO TABS: 1.0000 | ORAL_TABLET | Freq: Four times a day (QID) | ORAL | 0 refills | Status: DC | PRN

## 2016-08-02 MED ORDER — METHYLPREDNISOLONE 2 MG PO TABS: ORAL_TABLET | ORAL | 0 refills | Status: DC
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
## 2016-08-05 ENCOUNTER — Telehealth: Payer: Self-pay | Admitting: *Deleted

## 2016-08-05 NOTE — Telephone Encounter (Signed)
Received call from patient.   Requested refill on Vyvanse.   Ok to refill??  Last office visit/ refill 07/07/2016, #2 printed prescriptions.

## 2016-08-06 MED ORDER — LISDEXAMFETAMINE DIMESYLATE 50 MG PO CAPS: 50.0000 mg | ORAL_CAPSULE | Freq: Every day | ORAL | 0 refills | Status: DC

## 2016-08-06 NOTE — Telephone Encounter (Signed)
Okay to give April/may script

## 2016-08-06 NOTE — Telephone Encounter (Signed)
Prescriptions printed and patient made aware to come to office to pick up on Monday, 08/09/2016.

## 2016-08-10 ENCOUNTER — Ambulatory Visit
Admission: RE | Admit: 2016-08-10 | Discharge: 2016-08-10 | Disposition: A | Discharge status: Home / Self Care | Payer: Medicaid Other | Source: Ambulatory Visit | Attending: Surgery | Admitting: Surgery

## 2016-08-10 DIAGNOSIS — M4317 Spondylolisthesis, lumbosacral region: Secondary | ICD-10-CM

## 2016-08-10 DIAGNOSIS — M545 Low back pain: Secondary | ICD-10-CM

## 2016-08-16 ENCOUNTER — Encounter (INDEPENDENT_AMBULATORY_CARE_PROVIDER_SITE_OTHER): Payer: Self-pay | Admitting: Specialist

## 2016-08-16 ENCOUNTER — Ambulatory Visit (INDEPENDENT_AMBULATORY_CARE_PROVIDER_SITE_OTHER): Payer: Medicaid Other | Admitting: Specialist

## 2016-08-16 VITALS — BP 107/43 | HR 75 | Ht 62.0 in | Wt 186.0 lb

## 2016-08-16 DIAGNOSIS — M5136 Other intervertebral disc degeneration, lumbar region: Secondary | ICD-10-CM

## 2016-08-16 DIAGNOSIS — M5126 Other intervertebral disc displacement, lumbar region: Secondary | ICD-10-CM | POA: Diagnosis not present

## 2016-08-16 DIAGNOSIS — M48062 Spinal stenosis, lumbar region with neurogenic claudication: Secondary | ICD-10-CM

## 2016-08-16 NOTE — Patient Instructions (Signed)
Avoid bending, stooping and avoid lifting weights greater than 10 lbs. Avoid prolong standing and walking. Avoid frequent bending and stooping  No lifting greater than 10 lbs. May use ice or moist heat for pain. Weight loss is of benefit. Handicap license is approved. Dr. Newton's secretary/Assistant will call to arrange for epidural steroid injection  
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
## 2016-08-19 NOTE — Addendum Note (Signed)
Addended by: Vira BrownsNITKA, Emelyn Roen on: 08/19/2016 08:19 AM   Modules accepted: Orders

## 2016-08-23 ENCOUNTER — Ambulatory Visit (INDEPENDENT_AMBULATORY_CARE_PROVIDER_SITE_OTHER): Payer: Medicaid Other | Admitting: Physical Medicine and Rehabilitation

## 2016-08-23 ENCOUNTER — Ambulatory Visit (INDEPENDENT_AMBULATORY_CARE_PROVIDER_SITE_OTHER): Payer: Medicaid Other

## 2016-08-23 ENCOUNTER — Encounter (INDEPENDENT_AMBULATORY_CARE_PROVIDER_SITE_OTHER): Payer: Self-pay | Admitting: Physical Medicine and Rehabilitation

## 2016-08-23 VITALS — BP 127/97 | HR 94

## 2016-08-23 DIAGNOSIS — M5441 Lumbago with sciatica, right side: Secondary | ICD-10-CM

## 2016-08-23 DIAGNOSIS — M5416 Radiculopathy, lumbar region: Secondary | ICD-10-CM | POA: Diagnosis not present

## 2016-08-23 DIAGNOSIS — M5442 Lumbago with sciatica, left side: Secondary | ICD-10-CM

## 2016-08-23 DIAGNOSIS — G8929 Other chronic pain: Secondary | ICD-10-CM

## 2016-08-23 DIAGNOSIS — G894 Chronic pain syndrome: Secondary | ICD-10-CM

## 2016-08-23 MED ORDER — LIDOCAINE HCL (PF) 1 % IJ SOLN
0.3300 mL | Freq: Once | INTRAMUSCULAR | Status: AC
  Administered 2016-08-23: 0.3 mL

## 2016-08-23 MED ORDER — METHYLPREDNISOLONE ACETATE 80 MG/ML IJ SUSP
80.0000 mg | Freq: Once | INTRAMUSCULAR | Status: AC
  Administered 2016-08-23: 80 mg
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
## 2016-08-23 NOTE — Patient Instructions (Signed)

## 2016-08-24 DIAGNOSIS — G8929 Other chronic pain: Secondary | ICD-10-CM | POA: Insufficient documentation

## 2016-08-24 DIAGNOSIS — G894 Chronic pain syndrome: Secondary | ICD-10-CM | POA: Insufficient documentation

## 2016-08-24 DIAGNOSIS — M5442 Lumbago with sciatica, left side: Secondary | ICD-10-CM

## 2016-08-24 DIAGNOSIS — M5416 Radiculopathy, lumbar region: Secondary | ICD-10-CM | POA: Insufficient documentation

## 2016-08-24 DIAGNOSIS — M5441 Lumbago with sciatica, right side: Secondary | ICD-10-CM

## 2016-08-24 NOTE — Procedures (Signed)
Lumbar Epidural Steroid Injection - Interlaminar Approach with Fluoroscopic Guidance  French: Rachel French      Date of Birth: 09/30/1975 MRN: [id number] PCP: Milinda AntisURHAM, KAWANTA, MD      Visit Date: 08/23/2016   Universal Protocol:    Date/Time: 03/27/185:46 AM  Consent Given By: Rachel French  Position: PRONE  Additional Comments: Vital signs were monitored before and after Rachel procedure. French was prepped and draped in Rachel usual sterile fashion. Rachel correct French, procedure, and site was verified.   Injection Procedure Details:  Procedure Site One Meds Administered:  Meds ordered this encounter  Medications  . lidocaine (PF) (XYLOCAINE) 1 % injection 0.3 mL  . methylPREDNISolone acetate (DEPO-MEDROL) injection 80 mg     Laterality: Right  Location/Site:  L5-S1  Needle size: 20 G  Needle type: Tuohy  Needle Placement: Paramedian epidural  Findings:  -Contrast Used: 1 mL iohexol 180 mg iodine/mL   -Comments: Excellent flow of contrast into Rachel epidural space.  Procedure Details: Using a paramedian approach from Rachel side mentioned above, Rachel region overlying Rachel inferior lamina was localized under fluoroscopic visualization and Rachel soft tissues overlying this structure were infiltrated with 4 ml. of 1% Lidocaine without Epinephrine. Rachel Tuohy needle was inserted into Rachel epidural space using a paramedian approach.   Rachel epidural space was localized using loss of resistance along with lateral and bi-planar fluoroscopic views.  After negative aspirate for air, blood, and CSF, a 2 ml. volume of Isovue-250 was injected into Rachel epidural space and Rachel flow of contrast was observed. Radiographs were obtained for documentation purposes.    Rachel injectate was administered into Rachel level noted above.   Additional Comments:  Rachel French tolerated Rachel procedure well Dressing: Band-Aid    Post-procedure details: French was observed during Rachel procedure. Post-procedure  instructions were reviewed.  French left Rachel clinic in stable condition.

## 2016-08-26 ENCOUNTER — Other Ambulatory Visit (INDEPENDENT_AMBULATORY_CARE_PROVIDER_SITE_OTHER): Payer: Self-pay | Admitting: Specialist

## 2016-08-26 DIAGNOSIS — M4317 Spondylolisthesis, lumbosacral region: Secondary | ICD-10-CM

## 2016-08-31 NOTE — Progress Notes (Signed)
Referral put at the front desk for pt to pick up

## 2016-09-06 ENCOUNTER — Telehealth (INDEPENDENT_AMBULATORY_CARE_PROVIDER_SITE_OTHER): Payer: Self-pay | Admitting: Radiology

## 2016-09-06 NOTE — Telephone Encounter (Signed)
Rachel French is requesting a copy of her xrays and her recent MRI---- Please copy for her and call her and let and her know that it is ready for pick up.

## 2016-09-06 NOTE — Telephone Encounter (Signed)
CD is made and ready for pick up. Patient is aware.

## 2016-09-07 ENCOUNTER — Telehealth (INDEPENDENT_AMBULATORY_CARE_PROVIDER_SITE_OTHER): Payer: Self-pay | Admitting: *Deleted

## 2016-09-07 NOTE — Telephone Encounter (Signed)
Pt called stating she was referred to a chiropractor but they are not in her network.

## 2016-09-07 NOTE — Telephone Encounter (Signed)
Pt called stating she was referred to a chiropractor but they are not in her network.----We sent patient to Dr. Genene Churn

## 2016-09-08 NOTE — Telephone Encounter (Signed)
I called and lmom letting her know of the message below from Dr. Elvera Maria can call me back with someone that is in her net work or she can sched her own appt.

## 2016-09-08 NOTE — Telephone Encounter (Signed)
I think she can go to which ever she likes, does she have one in Network that she wants to see?

## 2016-09-17 ENCOUNTER — Telehealth (INDEPENDENT_AMBULATORY_CARE_PROVIDER_SITE_OTHER): Payer: Self-pay | Admitting: Specialist

## 2016-09-17 NOTE — Telephone Encounter (Signed)
Patient called asking for a referral to be sent over to [facility] on [address] phone # [phone]  CB # [phone]

## 2016-09-22 NOTE — Telephone Encounter (Signed)
Put referral in chart

## 2016-09-23 ENCOUNTER — Ambulatory Visit (INDEPENDENT_AMBULATORY_CARE_PROVIDER_SITE_OTHER): Payer: Medicaid Other | Admitting: Specialist

## 2016-09-23 ENCOUNTER — Encounter (INDEPENDENT_AMBULATORY_CARE_PROVIDER_SITE_OTHER): Payer: Self-pay | Admitting: Specialist

## 2016-09-23 VITALS — BP 115/80

## 2016-09-23 DIAGNOSIS — M5416 Radiculopathy, lumbar region: Secondary | ICD-10-CM | POA: Diagnosis not present
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
## 2016-09-23 NOTE — Addendum Note (Signed)
Addended by: Vira Browns on: 09/23/2016 05:19 PM   Modules accepted: Kipp Brood

## 2016-10-04 ENCOUNTER — Ambulatory Visit: Payer: Medicaid Other | Admitting: Family Medicine

## 2016-10-05 ENCOUNTER — Ambulatory Visit (INDEPENDENT_AMBULATORY_CARE_PROVIDER_SITE_OTHER): Payer: Medicaid Other

## 2016-10-05 ENCOUNTER — Encounter (INDEPENDENT_AMBULATORY_CARE_PROVIDER_SITE_OTHER): Payer: Self-pay | Admitting: Physical Medicine and Rehabilitation

## 2016-10-05 ENCOUNTER — Ambulatory Visit (INDEPENDENT_AMBULATORY_CARE_PROVIDER_SITE_OTHER): Payer: Medicaid Other | Admitting: Physical Medicine and Rehabilitation

## 2016-10-05 ENCOUNTER — Encounter: Payer: Self-pay | Admitting: Family Medicine

## 2016-10-05 ENCOUNTER — Ambulatory Visit (INDEPENDENT_AMBULATORY_CARE_PROVIDER_SITE_OTHER): Payer: Medicaid Other | Admitting: Family Medicine

## 2016-10-05 VITALS — BP 124/80 | HR 82 | Temp 98.2°F | Resp 14 | Ht 62.0 in | Wt 185.0 lb

## 2016-10-05 VITALS — BP 119/79 | HR 82

## 2016-10-05 DIAGNOSIS — F902 Attention-deficit hyperactivity disorder, combined type: Secondary | ICD-10-CM | POA: Diagnosis not present

## 2016-10-05 DIAGNOSIS — F418 Other specified anxiety disorders: Secondary | ICD-10-CM | POA: Diagnosis not present

## 2016-10-05 DIAGNOSIS — M5416 Radiculopathy, lumbar region: Secondary | ICD-10-CM

## 2016-10-05 DIAGNOSIS — M4316 Spondylolisthesis, lumbar region: Secondary | ICD-10-CM

## 2016-10-05 MED ORDER — LIDOCAINE HCL (PF) 1 % IJ SOLN
2.0000 mL | Freq: Once | INTRAMUSCULAR | Status: AC
  Administered 2016-10-05: 2 mL

## 2016-10-05 MED ORDER — METHYLPREDNISOLONE ACETATE 80 MG/ML IJ SUSP
80.0000 mg | Freq: Once | INTRAMUSCULAR | Status: AC
  Administered 2016-10-05: 80 mg

## 2016-10-05 MED ORDER — FLUOXETINE HCL 40 MG PO CAPS: 40.0000 mg | ORAL_CAPSULE | Freq: Every day | ORAL | 6 refills | Status: DC

## 2016-10-05 MED ORDER — LISDEXAMFETAMINE DIMESYLATE 50 MG PO CAPS: 50.0000 mg | ORAL_CAPSULE | Freq: Every day | ORAL | 0 refills | Status: DC

## 2016-10-05 MED ORDER — IIOPAMIDOL (ISOVUE-250) INJECTION 51%
3.0000 mL | Freq: Once | INTRAVENOUS | Status: AC
  Administered 2016-10-05: 3 mL
  Filled 2016-10-05: qty 50
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
## 2016-10-05 NOTE — Progress Notes (Deleted)
Patient is here today for repeat right L5-S1 interlaminar injection. Patient states she had relief with last injection for around 1 week. Having numbness and tingling in right leg into toes.

## 2016-10-05 NOTE — Patient Instructions (Addendum)
Prozac increased to 40mg  Continue vyvanse F/U 3 months for PHYSICAL

## 2016-10-05 NOTE — Assessment & Plan Note (Addendum)
Increase prozac to 40mg  once a day , recommend she continue with the family therapy We discussed setting some boundaries, she has a lot on her plate with her daughter, she has to say no to some of the other tasks especially if they are increasing her pain or causing more anxiety. She does have a therapy "dog" now as well for her and daughter. Continue vyvanse at current dose, I think her concentration issues are due mostly to the depression symptoms   F/U as scheduled for her pain management

## 2016-10-05 NOTE — Progress Notes (Signed)
   Subjective:    Patient ID: Rachel French, female    DOB: [birth year], 41 y.o.   MRN: [id number]  Patient presents for Follow-up (is fasting) Patient to follow-up medications. She is currently on Prozac 20 mg per depression and anxiety symptoms. She is also on Vyvanse for ADHD.  Does some therapy with her daughter, her daughter has Bipolar and is having a lot of "issues" right now. 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 SHe is also taking care of her [place] and feels overwhelmed with all of her responsibilities at times. She is also suffering with back injury and chronic pain which makes things more difficult. She has appt for pain management to have epidural this afternoon. She does feel the vyvanse helps but with the incraesed stress lately thinks her prozac dose could go up       Review Of Systems:  GEN- denies fatigue, fever, weight loss,weakness, recent illness HEENT- denies eye drainage, change in vision, nasal discharge, CVS- denies chest pain, palpitations RESP- denies SOB, cough, wheeze ABD- denies N/V, change in stools, abd pain Neuro- denies headache, dizziness, syncope, seizure activity       Objective:    BP 124/80   Pulse 82   Temp 98.2 F (36.8 C) (Oral)   Resp 14   Ht 5\' 2"  (1.575 m)   Wt 185 lb (83.9 kg)   SpO2 98%   BMI 33.84 kg/m  GEN- NAD, alert and oriented x3 Psych- Normal affect and mood, normal speech         Assessment & Plan:      Problem List Items Addressed This Visit    Depression with anxiety    Increase prozac to 40mg  once a day , recommend she continue with the family therapy We discussed setting some boundaries, she has a lot on her plate with her daughter, she has to say no to some of the other tasks especially if they are increasing her pain or causing more anxiety. She does have a therapy "dog" now as well for her and daughter. Continue vyvanse at current dose, I think her concentration issues are due mostly to the  depression symptoms   F/U as scheduled for her pain management       ADD (attention deficit disorder) - Primary      Note: This dictation was prepared with Dragon dictation along with smaller phrase technology. Any transcriptional errors that result from this process are unintentional.

## 2016-10-05 NOTE — Procedures (Signed)
Lumbar Epidural Steroid Injection - Interlaminar Approach with Fluoroscopic Guidance  Patient: Rachel French      Date of Birth: 08-22-1975 MRN: [id number] PCP: Salley Scarleturham, Kawanta F, MD      Visit Date: 10/05/2016   Universal Protocol:    Date/Time: 05/08/181:31 PM  Consent Given By: the patient  Position: PRONE  Additional Comments: Vital signs were monitored before and after the procedure. Patient was prepped and draped in the usual sterile fashion. The correct patient, procedure, and site was verified.   Injection Procedure Details:  Procedure Site One Meds Administered:  Meds ordered this encounter  Medications  . lidocaine (PF) (XYLOCAINE) 1 % injection 2 mL  . iopamidol (ISOVUE-250) 51 % injection 3 mL  . methylPREDNISolone acetate (DEPO-MEDROL) injection 80 mg     Laterality: Right  Location/Site:  L5-S1  Needle size: 20 G  Needle type: Tuohy  Needle Placement: Paramedian epidural  Findings:  -Contrast Used: 1 mL iohexol 180 mg iodine/mL   -Comments: Excellent flow of contrast into the epidural space.  Procedure Details: Using a paramedian approach from the side mentioned above, the region overlying the inferior lamina was localized under fluoroscopic visualization and the soft tissues overlying this structure were infiltrated with 4 ml. of 1% Lidocaine without Epinephrine. The Tuohy needle was inserted into the epidural space using a paramedian approach.   The epidural space was localized using loss of resistance along with lateral and bi-planar fluoroscopic views.  After negative aspirate for air, blood, and CSF, a 2 ml. volume of Isovue-250 was injected into the epidural space and the flow of contrast was observed. Radiographs were obtained for documentation purposes.    The injectate was administered into the level noted above.   Additional Comments:  The patient tolerated the procedure well Dressing: Band-Aid    Post-procedure details: Patient was  observed during the procedure. Post-procedure instructions were reviewed.  Patient left the clinic in stable condition.

## 2016-10-05 NOTE — Patient Instructions (Signed)

## 2016-10-20 ENCOUNTER — Ambulatory Visit (INDEPENDENT_AMBULATORY_CARE_PROVIDER_SITE_OTHER): Payer: Medicaid Other | Admitting: Specialist

## 2016-10-20 ENCOUNTER — Encounter (INDEPENDENT_AMBULATORY_CARE_PROVIDER_SITE_OTHER): Payer: Self-pay | Admitting: Specialist

## 2016-10-20 VITALS — BP 117/77 | HR 94 | Ht 62.0 in | Wt 180.0 lb

## 2016-10-20 DIAGNOSIS — M4316 Spondylolisthesis, lumbar region: Secondary | ICD-10-CM | POA: Diagnosis not present

## 2016-10-20 DIAGNOSIS — M48062 Spinal stenosis, lumbar region with neurogenic claudication: Secondary | ICD-10-CM

## 2016-10-20 DIAGNOSIS — W19XXXA Unspecified fall, initial encounter: Secondary | ICD-10-CM

## 2016-10-20 DIAGNOSIS — M5136 Other intervertebral disc degeneration, lumbar region: Secondary | ICD-10-CM | POA: Diagnosis not present

## 2016-10-20 MED ORDER — MELOXICAM 15 MG PO TABS: 15.0000 mg | ORAL_TABLET | Freq: Every day | ORAL | 6 refills | Status: DC
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
## 2016-10-20 NOTE — Patient Instructions (Signed)
Avoid bending, stooping and avoid lifting weights greater than 10 lbs. Avoid prolong standing and walking. Avoid frequent bending and stooping  No lifting greater than 10 lbs. May use ice or moist heat for pain. Weight loss is of benefit. Handicap license is approved.  
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
## 2016-10-21 ENCOUNTER — Encounter (HOSPITAL_COMMUNITY): Payer: Self-pay

## 2016-10-21 ENCOUNTER — Emergency Department (HOSPITAL_COMMUNITY)
Admission: EM | Admit: 2016-10-21 | Discharge: 2016-10-21 | Disposition: A | Discharge status: Home / Self Care | Payer: Medicaid Other | Attending: Emergency Medicine | Admitting: Emergency Medicine

## 2016-10-21 ENCOUNTER — Emergency Department (HOSPITAL_COMMUNITY): Payer: Medicaid Other

## 2016-10-21 DIAGNOSIS — F909 Attention-deficit hyperactivity disorder, unspecified type: Secondary | ICD-10-CM | POA: Diagnosis not present

## 2016-10-21 DIAGNOSIS — Y999 Unspecified external cause status: Secondary | ICD-10-CM | POA: Diagnosis not present

## 2016-10-21 DIAGNOSIS — Z79899 Other long term (current) drug therapy: Secondary | ICD-10-CM | POA: Insufficient documentation

## 2016-10-21 DIAGNOSIS — W270XXA Contact with workbench tool, initial encounter: Secondary | ICD-10-CM | POA: Diagnosis not present

## 2016-10-21 DIAGNOSIS — Y9389 Activity, other specified: Secondary | ICD-10-CM | POA: Insufficient documentation

## 2016-10-21 DIAGNOSIS — Z23 Encounter for immunization: Secondary | ICD-10-CM | POA: Insufficient documentation

## 2016-10-21 DIAGNOSIS — S61211A Laceration without foreign body of left index finger without damage to nail, initial encounter: Secondary | ICD-10-CM | POA: Diagnosis present

## 2016-10-21 DIAGNOSIS — Y929 Unspecified place or not applicable: Secondary | ICD-10-CM | POA: Insufficient documentation

## 2016-10-21 HISTORY — DX: Unspecified osteoarthritis, unspecified site: M19.90

## 2016-10-21 MED ORDER — LIDOCAINE HCL (PF) 1 % IJ SOLN
5.0000 mL | Freq: Once | INTRAMUSCULAR | Status: DC
  Filled 2016-10-21: qty 5

## 2016-10-21 MED ORDER — TETANUS-DIPHTH-ACELL PERTUSSIS 5-2.5-18.5 LF-MCG/0.5 IM SUSP
0.5000 mL | Freq: Once | INTRAMUSCULAR | Status: AC
  Administered 2016-10-21: 0.5 mL via INTRAMUSCULAR
  Filled 2016-10-21: qty 0.5
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
## 2016-10-21 NOTE — Discharge Instructions (Signed)
Keep wound clean using Dial antibacterial soap and water, pat dry. She may apply Neosporin ointment to wound twice daily. She may also take Tylenol or ibuprofen as prescribed over-the-counter for pain relief. I recommend following up with your primary care provider in the next 3 or 4 days for wound recheck. Return to the emergency department if symptoms worsen or new onset of fever, redness, swelling, warmth, drainage, decreased range of motion.

## 2016-10-21 NOTE — ED Notes (Signed)
Pt reports she is due for her tetanus shot. She states she has a area of skin missing either from a saw or piece of plastic stating "it flew up, im not sure what cut it".

## 2016-10-21 NOTE — ED Notes (Signed)
Cleansed pt's wound- irrigated. Applied bacitracin and nonadherent dressing to site.

## 2016-10-21 NOTE — ED Triage Notes (Signed)
Per Pt, pt cut her left index finger on a hand saw. Laceration noted with controlled bleeding.

## 2016-10-22 ENCOUNTER — Ambulatory Visit: Payer: Medicaid Other | Admitting: Physical Therapy

## 2016-11-01 ENCOUNTER — Encounter: Payer: Self-pay | Admitting: Physical Therapy

## 2016-11-01 ENCOUNTER — Ambulatory Visit: Payer: Medicaid Other | Attending: Specialist | Admitting: Physical Therapy

## 2016-11-01 DIAGNOSIS — M5441 Lumbago with sciatica, right side: Secondary | ICD-10-CM

## 2016-11-01 NOTE — Patient Instructions (Signed)
   Piriformis stretch:  Hold each stretch 30 seconds Repeat 5 times on each LE    Rotate hips to each side hold 20-30 seconds Repeat 3 times to each side    Single Knee to chest:  Bring one leg up at a time holding 30 seconds Repeat 3 times on each LE    Pull one leg across your body and hold 30 seconds Repeat  3 times each side    Bridges: Tighten your abdominals, flattening out your low back on the floor Lift your bottom up and hold 10 seconds Repeat 15 times
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
## 2016-11-12 ENCOUNTER — Telehealth (INDEPENDENT_AMBULATORY_CARE_PROVIDER_SITE_OTHER): Payer: Self-pay

## 2016-11-12 NOTE — Telephone Encounter (Signed)
Pt left vm requesting another injection. Had Rt L5-S1 IL on 10/05/16. Ok to repeat?

## 2016-11-15 ENCOUNTER — Encounter: Payer: Self-pay | Admitting: Gynecology

## 2016-11-15 ENCOUNTER — Ambulatory Visit (INDEPENDENT_AMBULATORY_CARE_PROVIDER_SITE_OTHER): Payer: Medicaid Other | Admitting: Gynecology

## 2016-11-15 VITALS — BP 122/76 | Ht 62.0 in | Wt 185.0 lb

## 2016-11-15 DIAGNOSIS — Z0000 Encounter for general adult medical examination without abnormal findings: Secondary | ICD-10-CM

## 2016-11-15 DIAGNOSIS — Z01419 Encounter for gynecological examination (general) (routine) without abnormal findings: Secondary | ICD-10-CM

## 2016-11-15 NOTE — Patient Instructions (Signed)
Follow up for Mirena IUD placement exam as we discussed.   Follow up in one year for annual exam.

## 2016-11-15 NOTE — Telephone Encounter (Signed)
Not helping very long, run by Dr. Otelia SergeantNitka, maybe suggest facet block?

## 2016-11-15 NOTE — Telephone Encounter (Signed)
Patient called Dr. Alvester MorinNewton requesting another injection and per Dr. Alvester MorinNewton injections are not lasting long, wants to know if you would recommend a facet block?

## 2016-11-15 NOTE — Progress Notes (Signed)
    Rachel French 1976/04/11 [id number]        41 y.o.  G2P0011 for annual exam.  Had been on [name] for menstrual regulation with history of PCOS but discontinued because she could never remember to take them. Wants to consider Mirena IUD which she has used in the past and done well with this.  Past medical history,surgical history, problem list, medications, allergies, family history and social history were all reviewed and documented as reviewed in the EPIC chart.  ROS:  Performed with pertinent positives and negatives included in the history, assessment and plan.   Additional significant findings :  None   Exam: Kennon PortelaKim Gardner assistant Vitals:   11/15/16 1419  BP: 122/76  Weight: 185 lb (83.9 kg)  Height: 5\' 2"  (1.575 m)   Body mass index is 33.84 kg/m.  General appearance:  Normal affect, orientation and appearance. Skin: Grossly normal HEENT: Without gross lesions.  No cervical or supraclavicular adenopathy. Thyroid normal.  Lungs:  Clear without wheezing, rales or rhonchi Cardiac: RR, without RMG Abdominal:  Soft, nontender, without masses, guarding, rebound, organomegaly or hernia Breasts:  Examined lying and sitting without masses, retractions, discharge or axillary adenopathy. Pelvic:  Ext, BUS, Vagina: Normal  Cervix: Normal  Uterus: Retroverted, normal size, shape and contour, midline and mobile nontender   Adnexa: Without masses or tenderness    Anus and perineum: Normal   Rectovaginal: Normal sphincter tone without palpated masses or tenderness.    Assessment/Plan:  41 y.o. 712P0011 female for annual exam with regular monthly menses.   1. Mirena IUD. Patient wants to consider Mirena IUD. Has used before and did well with this. I reviewed the insertional process with her and benefits as far as menstrual suppression. Essure contraception also reviewed. Patient's going to make arrangements for this and follow up for placement. 2. Mammography 08/2015. Recommend repeat  mammogram now that one year interval. Patient will arrange. SBE monthly reviewed. 3. Pap smear 2017. No Pap smear done today. History of LGSIL with positive high-risk HPV in the past. Most recently Pap smear/HPV negative 2015, Pap smear 2016 and 2017 also negative. Per current screening guidelines will plan less frequent screening intervals. 4. Health maintenance. No routine blood work done as this is done elsewhere. Follow up for Mirena IUD placement. Follow up in one year for annual exam.   Dara LordsFONTAINE,Ishmeal Rorie P MD, 2:41 PM 11/15/2016

## 2016-11-18 ENCOUNTER — Encounter: Payer: Self-pay | Admitting: Gynecology

## 2016-11-18 ENCOUNTER — Ambulatory Visit (INDEPENDENT_AMBULATORY_CARE_PROVIDER_SITE_OTHER): Payer: Medicaid Other | Admitting: Gynecology

## 2016-11-18 VITALS — BP 118/74

## 2016-11-18 DIAGNOSIS — Z30430 Encounter for insertion of intrauterine contraceptive device: Secondary | ICD-10-CM | POA: Diagnosis not present

## 2016-11-18 HISTORY — PX: INTRAUTERINE DEVICE INSERTION: SHX323

## 2016-11-18 NOTE — Progress Notes (Signed)
    Rachel French 06/02/1975 [id number]        41 y.o.  G2P0011  presents for Mirena IUD placement. She has read through the booklet, has no contraindications and signed the consent form. She currently is on a normal menses.  I reviewed the insertional process with her as well as the risks to include infection, either immediate or long-term, uterine perforation or migration requiring surgery to remove, other complications such as pain, hormonal side effects and possibility of failure with subsequent pregnancy.   Exam with Rachel HeritageKim French assistant Vitals:   11/18/16 1225  BP: 118/74    Pelvic: External BUS vagina normal. Cervix normal with light menses. Uterus retroverted normal size shape contour midline mobile nontender. Adnexa without masses or tenderness.  Procedure: The cervix was cleansed with Betadine, anterior lip grasped with a single-tooth tenaculum, the uterus was sounded and a Mirena IUD was placed according to manufacturer's recommendations without difficulty. The strings were trimmed. The patient tolerated well and will follow up in one month for a postinsertional check.  Lot number:  [id number]    Rachel French P MD, 12:41 PM 11/18/2016

## 2016-11-18 NOTE — Patient Instructions (Signed)

## 2016-11-29 ENCOUNTER — Encounter (INDEPENDENT_AMBULATORY_CARE_PROVIDER_SITE_OTHER): Payer: Self-pay | Admitting: Specialist

## 2016-11-29 ENCOUNTER — Ambulatory Visit (INDEPENDENT_AMBULATORY_CARE_PROVIDER_SITE_OTHER): Payer: Medicaid Other | Admitting: Specialist

## 2016-11-29 VITALS — BP 122/75 | HR 86 | Ht 62.0 in | Wt 180.0 lb

## 2016-11-29 DIAGNOSIS — M5137 Other intervertebral disc degeneration, lumbosacral region: Secondary | ICD-10-CM

## 2016-11-29 DIAGNOSIS — M5442 Lumbago with sciatica, left side: Secondary | ICD-10-CM

## 2016-11-29 DIAGNOSIS — M4316 Spondylolisthesis, lumbar region: Secondary | ICD-10-CM | POA: Diagnosis not present

## 2016-11-29 DIAGNOSIS — M48062 Spinal stenosis, lumbar region with neurogenic claudication: Secondary | ICD-10-CM | POA: Diagnosis not present

## 2016-11-29 DIAGNOSIS — M5441 Lumbago with sciatica, right side: Secondary | ICD-10-CM | POA: Diagnosis not present

## 2016-11-29 DIAGNOSIS — M5416 Radiculopathy, lumbar region: Secondary | ICD-10-CM | POA: Diagnosis not present

## 2016-11-29 DIAGNOSIS — G8929 Other chronic pain: Secondary | ICD-10-CM

## 2016-11-29 MED ORDER — DICLOFENAC-MISOPROSTOL 50-0.2 MG PO TBEC: 1.0000 | DELAYED_RELEASE_TABLET | Freq: Three times a day (TID) | ORAL | 2 refills | Status: DC

## 2016-11-29 NOTE — Patient Instructions (Signed)
Avoid bending, stooping and avoid lifting weights greater than 10 lbs. Avoid prolong standing and walking. Avoid frequent bending and stooping  No lifting greater than 10 lbs. May use ice or moist heat for pain. Weight loss is of benefit. Handicap license is approved.  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 A 3 level fusion is  Likely to cause you to be disabled.  If you are able to delay surgery or avoid with disability then the need for  Surgery is delayed or potentially avoided.
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
## 2016-12-03 NOTE — Telephone Encounter (Signed)
I saw in the office she is having neurogenic symptoms of claudication and has multilevel DDD with foramenal entrapment, at least 3 levels. Much of her pain is central to the back as well and leg symptoms. Unfortunately  A three level lumbar fusion may be the only solution. I've told her to go ahead apply for SSDI with the combination of cervical and lumbar degenerative disc disease she can have surgery and declare disablility or perhaps declare disablility and maybe be able to delay surgery decreases some of the risks for adjacent level disease which would be  A likely further problem with fusion at age 41 over 3 levels. jen

## 2016-12-06 ENCOUNTER — Telehealth: Payer: Self-pay | Admitting: *Deleted

## 2016-12-06 ENCOUNTER — Telehealth (INDEPENDENT_AMBULATORY_CARE_PROVIDER_SITE_OTHER): Payer: Self-pay | Admitting: Specialist

## 2016-12-06 MED ORDER — LISDEXAMFETAMINE DIMESYLATE 50 MG PO CAPS: 50.0000 mg | ORAL_CAPSULE | Freq: Every day | ORAL | 0 refills | Status: DC

## 2016-12-06 NOTE — Telephone Encounter (Signed)
Patient called advised her insurance company need prior auth. Before they will fill her Rx.  written by Dr. Otelia SergeantNitka. The number to contact patient is [phone]

## 2016-12-06 NOTE — Telephone Encounter (Signed)
Received call from patient.  Requested refill on Vyvanse.   Ok to refill??  Last office visit/ refill 10/05/2016, #2 printed prescriptions.

## 2016-12-06 NOTE — Telephone Encounter (Signed)
ok 

## 2016-12-06 NOTE — Telephone Encounter (Signed)
Prescription printed x1 and patient made aware to come to office to pick up.

## 2016-12-09 NOTE — Telephone Encounter (Signed)
Prior auth done

## 2016-12-15 ENCOUNTER — Ambulatory Visit (INDEPENDENT_AMBULATORY_CARE_PROVIDER_SITE_OTHER): Payer: Medicaid Other | Admitting: Gynecology

## 2016-12-15 ENCOUNTER — Encounter: Payer: Self-pay | Admitting: Gynecology

## 2016-12-15 VITALS — BP 120/76

## 2016-12-15 DIAGNOSIS — Z30431 Encounter for routine checking of intrauterine contraceptive device: Secondary | ICD-10-CM | POA: Diagnosis not present

## 2016-12-15 NOTE — Progress Notes (Signed)
   Patient is a [age] who presented to the office for her 1 month follow-up after having had the Mirena IUD placed. Her only complaint is that her husband is felt the IUD string and would like to have a trimmed. She is otherwise done well.  Exam: Pelvic: Bartholin urethra Skene was within normal limits Vagina: No lesions or discharge Cervix: IUD string visualized it was trimmed Bimanual exam uterus anteverted normal size shape and consistency Adnexa: No palpable masses or tenderness Rectal exam: Not done  Assessment/plan: Patient doing well 1 month after placing Mirena IUD the IUD string was trimmed. Patient otherwise scheduled to return to the office in one year for annual exam or when necessary.

## 2017-01-05 ENCOUNTER — Encounter: Payer: Medicaid Other | Admitting: Family Medicine

## 2017-01-13 ENCOUNTER — Other Ambulatory Visit: Payer: Self-pay | Admitting: *Deleted

## 2017-01-13 NOTE — Telephone Encounter (Signed)
Received call from patient.   Requested refill on Vyvanse.   Ok to refill??  Last office visit 10/05/2016.  Last refill 12/06/2016.

## 2017-01-14 MED ORDER — LISDEXAMFETAMINE DIMESYLATE 50 MG PO CAPS: 50.0000 mg | ORAL_CAPSULE | Freq: Every day | ORAL | 0 refills | Status: DC

## 2017-01-14 NOTE — Telephone Encounter (Signed)
Okay to refill? 

## 2017-01-14 NOTE — Telephone Encounter (Signed)
Rx printed patient can pick up after 4pm  today

## 2017-02-15 ENCOUNTER — Telehealth: Payer: Self-pay | Admitting: *Deleted

## 2017-02-15 MED ORDER — LISDEXAMFETAMINE DIMESYLATE 50 MG PO CAPS: 50.0000 mg | ORAL_CAPSULE | Freq: Every day | ORAL | 0 refills | Status: DC

## 2017-02-15 NOTE — Telephone Encounter (Signed)
okay

## 2017-02-15 NOTE — Telephone Encounter (Signed)
Received call from patient.   Requested refill on Vyvanse.   Ok to refill??  Last office visit 10/05/2016.  Last refill 01/14/2017.

## 2017-02-15 NOTE — Telephone Encounter (Signed)
Prescription printed and patient made aware to come to office to pick up on 02/16/2017.

## 2017-02-28 ENCOUNTER — Encounter (INDEPENDENT_AMBULATORY_CARE_PROVIDER_SITE_OTHER): Payer: Self-pay | Admitting: Specialist

## 2017-02-28 ENCOUNTER — Ambulatory Visit (INDEPENDENT_AMBULATORY_CARE_PROVIDER_SITE_OTHER): Payer: Medicaid Other | Admitting: Specialist

## 2017-02-28 VITALS — BP 145/89 | HR 111 | Ht 62.0 in | Wt 180.0 lb

## 2017-02-28 DIAGNOSIS — M5136 Other intervertebral disc degeneration, lumbar region: Secondary | ICD-10-CM

## 2017-02-28 DIAGNOSIS — M5126 Other intervertebral disc displacement, lumbar region: Secondary | ICD-10-CM

## 2017-02-28 DIAGNOSIS — M4316 Spondylolisthesis, lumbar region: Secondary | ICD-10-CM

## 2017-02-28 DIAGNOSIS — M48062 Spinal stenosis, lumbar region with neurogenic claudication: Secondary | ICD-10-CM

## 2017-02-28 MED ORDER — TRAMADOL HCL 50 MG PO TABS: 50.0000 mg | ORAL_TABLET | Freq: Four times a day (QID) | ORAL | 0 refills | Status: DC | PRN
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
## 2017-02-28 NOTE — Patient Instructions (Signed)
Plan: Avoid bending, stooping and avoid lifting weights greater than 10 lbs. Avoid prolong standing and walking. Order for a new walker with wheels. Surgery scheduling secretary Tivis Ringer, will call you in the next week to schedule for surgery.  Surgery recommended is a three level lumbar fusionL3-4, L4-5 and  L5-S1 this would be done with rods, screws and cages at L4-5 and L5-S1 with local bone graft and allograft (donor bone graft).Vivigen. Risk of surgery includes risk of infection 1 in 200 patients, bleeding 1/2% chance you would need a transfusion.   Risk to the nerves is one in 10,000. You will need to use a brace for 3 months and wean from the brace on the 4th month. Expect improved walking and standing tolerance. Expect relief of leg pain but numbness may persist depending on the length and degree of pressure that has been present.

## 2017-03-22 ENCOUNTER — Telehealth: Payer: Self-pay | Admitting: *Deleted

## 2017-03-22 MED ORDER — LISDEXAMFETAMINE DIMESYLATE 50 MG PO CAPS: 50.0000 mg | ORAL_CAPSULE | Freq: Every day | ORAL | 0 refills | Status: DC

## 2017-03-22 NOTE — Telephone Encounter (Signed)
Give 1 refill, needs OV before any further refills

## 2017-03-22 NOTE — Telephone Encounter (Signed)
Prescription printed and patient made aware to come to office to pick up on 03/23/2017 per VM.

## 2017-03-22 NOTE — Telephone Encounter (Signed)
Received call from patient.   Requested refill on Vyvanse.   Ok to refill??  Last office visit 10/05/2016.  Last refill 02/15/2017.

## 2017-04-04 ENCOUNTER — Encounter: Payer: Self-pay | Admitting: Family Medicine

## 2017-04-04 NOTE — Progress Notes (Signed)
   Subjective:    Patient ID: Rachel French, female    DOB: [birth year], 41 y.o.   MRN: [id number]  Patient presents for No chief complaint on file. Pt here for CPE  GYN- Dr. Audie BoxFontaine , PAP SMear UTD Immunizations- TDAP UTD, flu shot [place] Due for HIV screening  Due for Mammogram  Medications reviewed  ADHD- taking Vyvanse as prescribed, she is no longer working due to injury   Pt not seen due to insurance being inactive     Review Of Systems:  GEN- denies fatigue, fever, weight loss,weakness, recent illness HEENT- denies eye drainage, change in vision, nasal discharge, CVS- denies chest pain, palpitations RESP- denies SOB, cough, wheeze ABD- denies N/V, change in stools, abd pain GU- denies dysuria, hematuria, dribbling, incontinence MSK- denies joint pain, muscle aches, injury Neuro- denies headache, dizziness, syncope, seizure activity       Objective:    There were no vitals taken for this visit. GEN- NAD, alert and oriented x3 HEENT- PERRL, EOMI, non injected sclera, pink conjunctiva, MMM, oropharynx clear Neck- Supple, no thyromegaly CVS- RRR, no murmur RESP-CTAB ABD-NABS,soft,NT,ND EXT- No edema Pulses- Radial, DP- 2+        Assessment & Plan:      Problem List Items Addressed This Visit    None      Note: This dictation was prepared with Dragon dictation along with smaller phrase technology. Any transcriptional errors that result from this process are unintentional.

## 2017-04-25 ENCOUNTER — Telehealth: Payer: Self-pay | Admitting: Family Medicine

## 2017-04-25 MED ORDER — LISDEXAMFETAMINE DIMESYLATE 50 MG PO CAPS: 50.0000 mg | ORAL_CAPSULE | Freq: Every day | ORAL | 0 refills | Status: DC

## 2017-04-25 NOTE — Telephone Encounter (Signed)
Ok to refill??  Last office visit 10/05/2016.  Last refill 03/22/2017.  Of note, patient had to re-schedule CPE in Nov D/T insurance issues. Patient has appointment scheduled in Feb 2018.

## 2017-04-25 NOTE — Telephone Encounter (Signed)
Patient calling for rx for vyvanse  [phone]

## 2017-04-25 NOTE — Telephone Encounter (Signed)
Prescription printed and patient made aware to come to office to pick up.   Appointment scheduled.  

## 2017-04-25 NOTE — Telephone Encounter (Signed)
Give 1 refill 

## 2017-05-04 ENCOUNTER — Encounter: Payer: Self-pay | Admitting: Family Medicine

## 2017-05-04 ENCOUNTER — Ambulatory Visit (INDEPENDENT_AMBULATORY_CARE_PROVIDER_SITE_OTHER): Payer: Medicaid Other | Admitting: Family Medicine

## 2017-05-04 VITALS — BP 112/80 | HR 90 | Temp 97.5°F | Resp 18 | Ht 62.0 in | Wt 196.0 lb

## 2017-05-04 DIAGNOSIS — G8929 Other chronic pain: Secondary | ICD-10-CM

## 2017-05-04 DIAGNOSIS — M5441 Lumbago with sciatica, right side: Secondary | ICD-10-CM

## 2017-05-04 DIAGNOSIS — F418 Other specified anxiety disorders: Secondary | ICD-10-CM | POA: Diagnosis not present

## 2017-05-04 DIAGNOSIS — E669 Obesity, unspecified: Secondary | ICD-10-CM | POA: Diagnosis not present

## 2017-05-04 DIAGNOSIS — F902 Attention-deficit hyperactivity disorder, combined type: Secondary | ICD-10-CM

## 2017-05-04 DIAGNOSIS — M5442 Lumbago with sciatica, left side: Secondary | ICD-10-CM | POA: Diagnosis not present

## 2017-05-04 MED ORDER — LISDEXAMFETAMINE DIMESYLATE 50 MG PO CAPS: 50.0000 mg | ORAL_CAPSULE | Freq: Every day | ORAL | 0 refills | Status: DC

## 2017-05-04 NOTE — Assessment & Plan Note (Signed)
Continue with the Vyvanse as prescribed given prescription for January.
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
## 2017-05-04 NOTE — Assessment & Plan Note (Signed)
Chronic back pain requiring surgical intervention.  Recommend that she call her neurosurgery to get her surgery set up.  I do not make any changes to her as needed pain medications.

## 2017-05-04 NOTE — Assessment & Plan Note (Signed)
She does get some psychotherapy as she is in therapy with her daughter.  Declines personal psychotherapy.  She is now back on her Prozac we will continue the 40 mg daily.

## 2017-05-04 NOTE — Assessment & Plan Note (Signed)
Discussed dietary changes cutting out the sugar and the carbs fast food especially she cannot be mobile secondary to her back issues and need for surgery.  She is going to follow-up in February for her complete physical exam fasting labs will be done at that time.   General I think that she can be cleared for surgical intervention

## 2017-05-04 NOTE — Patient Instructions (Addendum)
F/U  For Physical

## 2017-05-22 ENCOUNTER — Other Ambulatory Visit: Payer: Self-pay | Admitting: Family Medicine

## 2017-06-09 ENCOUNTER — Other Ambulatory Visit: Payer: Self-pay | Admitting: Family Medicine

## 2017-06-15 ENCOUNTER — Telehealth (INDEPENDENT_AMBULATORY_CARE_PROVIDER_SITE_OTHER): Payer: Self-pay | Admitting: Specialist

## 2017-06-15 NOTE — Telephone Encounter (Signed)
Patient called wanting to get her surgery scheduled.  CB#[phone].  Thank you

## 2017-06-15 NOTE — Telephone Encounter (Signed)
See message.  I do not have surgery order.  Please write order.  Thanks!

## 2017-06-20 ENCOUNTER — Other Ambulatory Visit: Payer: Self-pay | Admitting: Family Medicine

## 2017-06-28 ENCOUNTER — Other Ambulatory Visit: Payer: Self-pay | Admitting: Family Medicine

## 2017-06-28 ENCOUNTER — Telehealth: Payer: Self-pay | Admitting: Family Medicine

## 2017-06-28 MED ORDER — LISDEXAMFETAMINE DIMESYLATE 50 MG PO CAPS: 50.0000 mg | ORAL_CAPSULE | Freq: Every day | ORAL | 0 refills | Status: DC

## 2017-06-28 NOTE — Telephone Encounter (Signed)
Pt needs refill on vyvanse to [facility] on [address].

## 2017-06-28 NOTE — Telephone Encounter (Signed)
Patient was given extra prescription during CPE in Dec.   Prescriptions were put on file at pharmacy.   Call placed to patient and patient made aware.

## 2017-06-28 NOTE — Telephone Encounter (Signed)
Call placed to pharmacy this AM and was advised that refill for Vyvanse is on file.   Patient reports that pharmacy states that no refill on file.   Call placed to pharmacy and was advised of the same.   Ok to send to pharmacy?

## 2017-06-28 NOTE — Telephone Encounter (Signed)
Patient is calling to say that she called here to say she needed a refill on her vyvanse, however we told her she had refills she called the pharmacy and she had a refill for January but not February  [phone]

## 2017-07-13 ENCOUNTER — Ambulatory Visit (INDEPENDENT_AMBULATORY_CARE_PROVIDER_SITE_OTHER): Payer: Medicaid Other | Admitting: Family Medicine

## 2017-07-13 ENCOUNTER — Encounter: Payer: Self-pay | Admitting: Family Medicine

## 2017-07-13 ENCOUNTER — Other Ambulatory Visit: Payer: Self-pay

## 2017-07-13 VITALS — BP 120/64 | HR 106 | Temp 98.5°F | Resp 16 | Ht 62.0 in | Wt 201.0 lb

## 2017-07-13 DIAGNOSIS — F418 Other specified anxiety disorders: Secondary | ICD-10-CM

## 2017-07-13 DIAGNOSIS — E282 Polycystic ovarian syndrome: Secondary | ICD-10-CM

## 2017-07-13 DIAGNOSIS — Z1231 Encounter for screening mammogram for malignant neoplasm of breast: Secondary | ICD-10-CM | POA: Diagnosis not present

## 2017-07-13 DIAGNOSIS — Z0000 Encounter for general adult medical examination without abnormal findings: Secondary | ICD-10-CM

## 2017-07-13 DIAGNOSIS — F902 Attention-deficit hyperactivity disorder, combined type: Secondary | ICD-10-CM | POA: Diagnosis not present

## 2017-07-13 DIAGNOSIS — E669 Obesity, unspecified: Secondary | ICD-10-CM | POA: Diagnosis not present

## 2017-07-13 DIAGNOSIS — Z1239 Encounter for other screening for malignant neoplasm of breast: Secondary | ICD-10-CM

## 2017-07-13 MED ORDER — VALACYCLOVIR HCL 1 G PO TABS: ORAL_TABLET | ORAL | 1 refills | Status: DC

## 2017-07-13 MED ORDER — FLUOXETINE HCL 40 MG PO CAPS: ORAL_CAPSULE | ORAL | 6 refills | Status: DC

## 2017-07-13 MED ORDER — LISDEXAMFETAMINE DIMESYLATE 50 MG PO CAPS: 50.0000 mg | ORAL_CAPSULE | Freq: Every day | ORAL | 0 refills | Status: DC

## 2017-07-13 NOTE — Progress Notes (Signed)
   Subjective:    Patient ID: Rachel French, female    DOB: 18-Jul-1975, 42 y.o.   MRN: [id number]  Patient presents for CPE (is fasting)   Pt here for CPE   HAS gyn   PAP Smear UTD  Immunizations- UTD   Disucssed HIV screening Due for mammogram  Family history reviewed   Gaining weight- was 180lbs in July, now 201lbs,  States she often skips meals or only eats once a day   24 hour Recall- toaster strudel Soda, Dinner- stuffed chicken veggies Soda, water , had cookies, twizzlers,  Has Known PCOS , was on Metformin in past which helped Currently has IUD which she also thinks makes her gain weight   Has home videos she is going to try for exercise  [facility]  Follows with dentist   ADD she is doing well on the Vyvanse.  States that it is tax season and she has been a little more stress than usual but overall feels like the medicine is still helping.  Depression she is still taking her fluoxetine feels like it does help with her anxiety and stress.  Review Of Systems:  GEN- denies fatigue, fever, weight loss,weakness, recent illness HEENT- denies eye drainage, change in vision, nasal discharge, CVS- denies chest pain, palpitations RESP- denies SOB, cough, wheeze ABD- denies N/V, change in stools, abd pain GU- denies dysuria, hematuria, dribbling, incontinence MSK- denies joint pain, muscle aches, injury Neuro- denies headache, dizziness, syncope, seizure activity       Objective:    BP 120/64   Pulse (!) 106   Temp 98.5 F (36.9 C) (Oral)   Resp 16   Ht 5\' 2"  (1.575 m)   Wt 201 lb (91.2 kg)   SpO2 97%   BMI 36.76 kg/m  GEN- NAD, alert and oriented x3 HEENT- PERRL, EOMI, non injected sclera, pink conjunctiva, MMM, oropharynx clear, TM clear no effusion, nares clear  Neck- Supple, no thyromegaly CVS- RRR, no murmur RESP-CTAB ABD-NABS,soft,NT,ND Psych- normal affect and mood  EXT- No edema Pulses- Radial, DP- 2+        Assessment & Plan:      Problem  List Items Addressed This Visit      Unprioritized   PCOS (polycystic ovarian syndrome)    Check labs, likley try METFORMIN again, with family history of diabetes and her obesity       Relevant Orders   Hemoglobin A1c   Lipid panel   Obesity (BMI 30-39.9)    Discussed dietary changes, cutting out the snacking on sweets Cut out soda Increase water  Eat 2-3 meals a day       Relevant Medications   lisdexamfetamine (VYVANSE) 50 MG capsule   Depression with anxiety    Continue prozac at current dose       Relevant Medications   FLUoxetine (PROZAC) 40 MG capsule   ADD (attention deficit disorder)    Continue vyvanse        Other Visit Diagnoses    Routine general medical examination at a health care facility    -  Primary   Relevant Orders   CBC with Differential/Platelet   Comprehensive metabolic panel   Lipid panel   HIV antibody      Note: This dictation was prepared with Dragon dictation along with smaller phrase technology. Any transcriptional errors that result from this process are unintentional.

## 2017-07-13 NOTE — Assessment & Plan Note (Signed)
Check labs, likley try METFORMIN again, with family history of diabetes and her obesity

## 2017-07-13 NOTE — Assessment & Plan Note (Signed)
Continue prozac at current dose

## 2017-07-13 NOTE — Assessment & Plan Note (Signed)
Discussed dietary changes, cutting out the snacking on sweets Cut out soda Increase water  Eat 2-3 meals a day

## 2017-07-13 NOTE — Patient Instructions (Addendum)
Schedule your mammogram  We will call with lab results F/U 4 months

## 2017-07-13 NOTE — Assessment & Plan Note (Signed)
Continue vyvanse 

## 2017-07-14 LAB — LIPID PANEL
CHOLESTEROL: 170 mg/dL (ref <200)
HDL: 45 mg/dL — AB (ref >50)
LDL Cholesterol (Calc): 105 mg/dL (calc) — ABNORMAL HIGH
NON-HDL CHOLESTEROL (CALC): 125 mg/dL (ref <130)
Total CHOL/HDL Ratio: 3.8 (calc) (ref <5.0)
Triglycerides: 100 mg/dL (ref <150)

## 2017-07-14 LAB — CBC WITH DIFFERENTIAL/PLATELET
BASOS PCT: 0.5 %
Basophils Absolute: 65 cells/uL (ref 0–200)
EOS ABS: 826 {cells}/uL — AB (ref 15–500)
Eosinophils Relative: 6.4 %
HCT: 41.8 % (ref 35.0–45.0)
Hemoglobin: 14.1 g/dL (ref 11.7–15.5)
Lymphs Abs: 3290 cells/uL (ref 850–3900)
MCH: 31.8 pg (ref 27.0–33.0)
MCHC: 33.7 g/dL (ref 32.0–36.0)
MCV: 94.1 fL (ref 80.0–100.0)
MONOS PCT: 10 %
MPV: 11.6 fL (ref 7.5–12.5)
Neutro Abs: 7430 cells/uL (ref 1500–7800)
Neutrophils Relative %: 57.6 %
Platelets: 446 10*3/uL — ABNORMAL HIGH (ref 140–400)
RBC: 4.44 10*6/uL (ref 3.80–5.10)
RDW: 12.8 % (ref 11.0–15.0)
TOTAL LYMPHOCYTE: 25.5 %
WBC mixed population: 1290 cells/uL — ABNORMAL HIGH (ref 200–950)
WBC: 12.9 10*3/uL — AB (ref 3.8–10.8)

## 2017-07-14 LAB — HIV ANTIBODY (ROUTINE TESTING W REFLEX): HIV 1&2 Ab, 4th Generation: NONREACTIVE

## 2017-07-14 LAB — COMPREHENSIVE METABOLIC PANEL
AG Ratio: 1.3 (calc) (ref 1.0–2.5)
ALKALINE PHOSPHATASE (APISO): 84 U/L (ref 33–115)
ALT: 15 U/L (ref 6–29)
AST: 15 U/L (ref 10–30)
Albumin: 4.1 g/dL (ref 3.6–5.1)
BUN: 14 mg/dL (ref 7–25)
CHLORIDE: 103 mmol/L (ref 98–110)
CO2: 23 mmol/L (ref 20–32)
CREATININE: 0.6 mg/dL (ref 0.50–1.10)
Calcium: 9.6 mg/dL (ref 8.6–10.2)
Globulin: 3.2 g/dL (calc) (ref 1.9–3.7)
Glucose, Bld: 113 mg/dL — ABNORMAL HIGH (ref 65–99)
Potassium: 4.4 mmol/L (ref 3.5–5.3)
Sodium: 136 mmol/L (ref 135–146)
Total Bilirubin: 0.3 mg/dL (ref 0.2–1.2)
Total Protein: 7.3 g/dL (ref 6.1–8.1)

## 2017-07-14 LAB — HEMOGLOBIN A1C
EAG (MMOL/L): 6.5 (calc)
HEMOGLOBIN A1C: 5.7 %{Hb} — AB (ref <5.7)
MEAN PLASMA GLUCOSE: 117 (calc)

## 2017-07-14 LAB — SPECIMEN COMPROMISED

## 2017-07-21 ENCOUNTER — Other Ambulatory Visit: Payer: Self-pay | Admitting: *Deleted

## 2017-07-21 MED ORDER — METFORMIN HCL 500 MG PO TABS: 500.0000 mg | ORAL_TABLET | Freq: Every day | ORAL | 3 refills | Status: DC

## 2017-08-09 ENCOUNTER — Ambulatory Visit: Payer: Self-pay

## 2017-08-10 ENCOUNTER — Other Ambulatory Visit (INDEPENDENT_AMBULATORY_CARE_PROVIDER_SITE_OTHER): Payer: Self-pay | Admitting: Specialist

## 2017-08-10 DIAGNOSIS — M5441 Lumbago with sciatica, right side: Secondary | ICD-10-CM

## 2017-08-10 DIAGNOSIS — M5416 Radiculopathy, lumbar region: Secondary | ICD-10-CM

## 2017-08-10 DIAGNOSIS — M5137 Other intervertebral disc degeneration, lumbosacral region: Secondary | ICD-10-CM

## 2017-08-10 DIAGNOSIS — G8929 Other chronic pain: Secondary | ICD-10-CM

## 2017-08-10 DIAGNOSIS — M4316 Spondylolisthesis, lumbar region: Secondary | ICD-10-CM

## 2017-08-10 DIAGNOSIS — M5442 Lumbago with sciatica, left side: Secondary | ICD-10-CM

## 2017-08-10 DIAGNOSIS — M48062 Spinal stenosis, lumbar region with neurogenic claudication: Secondary | ICD-10-CM

## 2017-08-11 NOTE — Telephone Encounter (Signed)
Please advise 

## 2017-08-29 ENCOUNTER — Encounter (INDEPENDENT_AMBULATORY_CARE_PROVIDER_SITE_OTHER): Payer: Self-pay | Admitting: Specialist

## 2017-08-29 ENCOUNTER — Ambulatory Visit (INDEPENDENT_AMBULATORY_CARE_PROVIDER_SITE_OTHER): Payer: Medicaid Other | Admitting: Orthopaedic Surgery

## 2017-08-29 ENCOUNTER — Ambulatory Visit (INDEPENDENT_AMBULATORY_CARE_PROVIDER_SITE_OTHER): Payer: Medicaid Other | Admitting: Specialist

## 2017-08-29 ENCOUNTER — Ambulatory Visit (INDEPENDENT_AMBULATORY_CARE_PROVIDER_SITE_OTHER): Payer: Medicaid Other

## 2017-08-29 VITALS — BP 119/77 | HR 94 | Temp 97.5°F | Ht 62.0 in | Wt 201.0 lb

## 2017-08-29 DIAGNOSIS — M4807 Spinal stenosis, lumbosacral region: Secondary | ICD-10-CM

## 2017-08-29 DIAGNOSIS — G8929 Other chronic pain: Secondary | ICD-10-CM | POA: Diagnosis not present

## 2017-08-29 DIAGNOSIS — M5441 Lumbago with sciatica, right side: Secondary | ICD-10-CM | POA: Diagnosis not present

## 2017-08-29 DIAGNOSIS — M5136 Other intervertebral disc degeneration, lumbar region: Secondary | ICD-10-CM

## 2017-08-29 DIAGNOSIS — M4317 Spondylolisthesis, lumbosacral region: Secondary | ICD-10-CM

## 2017-08-29 DIAGNOSIS — M5416 Radiculopathy, lumbar region: Secondary | ICD-10-CM

## 2017-08-29 DIAGNOSIS — M1711 Unilateral primary osteoarthritis, right knee: Secondary | ICD-10-CM | POA: Diagnosis not present

## 2017-08-29 DIAGNOSIS — M5442 Lumbago with sciatica, left side: Secondary | ICD-10-CM

## 2017-08-29 DIAGNOSIS — M5137 Other intervertebral disc degeneration, lumbosacral region: Secondary | ICD-10-CM | POA: Diagnosis not present

## 2017-08-29 DIAGNOSIS — M4316 Spondylolisthesis, lumbar region: Secondary | ICD-10-CM | POA: Diagnosis not present

## 2017-08-29 DIAGNOSIS — M25561 Pain in right knee: Secondary | ICD-10-CM | POA: Diagnosis not present

## 2017-08-29 DIAGNOSIS — M48062 Spinal stenosis, lumbar region with neurogenic claudication: Secondary | ICD-10-CM

## 2017-08-29 MED ORDER — BUPIVACAINE HCL 0.25 % IJ SOLN
4.0000 mL | INTRAMUSCULAR | Status: AC | PRN
  Administered 2017-08-29: 4 mL via INTRA_ARTICULAR

## 2017-08-29 MED ORDER — DICLOFENAC-MISOPROSTOL 50-0.2 MG PO TBEC: 1.0000 | DELAYED_RELEASE_TABLET | Freq: Three times a day (TID) | ORAL | 0 refills | Status: DC

## 2017-08-29 MED ORDER — METHYLPREDNISOLONE ACETATE 40 MG/ML IJ SUSP
40.0000 mg | INTRAMUSCULAR | Status: AC | PRN
  Administered 2017-08-29: 40 mg via INTRA_ARTICULAR
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
## 2017-08-29 NOTE — Patient Instructions (Addendum)
Plan: Avoid bending, stooping and avoid lifting weights greater than 10 lbs. Avoid prolong standing and walking. Order for a new walker with wheels. Surgery scheduling secretary Tivis RingerSherri Billings, will call you in the next week to schedule for surgery.  Surgery recommended is a three level lumbar fusion L3-4, L4-5 and  L5-S1 this would be done with rods, screws and cages at L3-4, L4-5 and L5-S1 with local bone graft and allograft (donor bone graft).Vivigen. Risk of surgery includes risk of infection 1 in 100 patients with diabetes, bleeding 1% chance you would need a transfusion.   Risk to the nerves is one in 10,000. You will need to use a brace for 3 months and wean from the brace on the 4th month. Expect improved walking and standing tolerance. Expect relief of leg pain but numbness may persist depending on the length and degree of pressure that has been present. Repeat Lumbar MRI as the most recent is over 4812 months old, schedule surgery post MRI.

## 2017-08-29 NOTE — Addendum Note (Signed)
Addended by: Vira BrownsNITKA, Madge Therrien on: 08/29/2017 11:22 AM   Modules accepted: Orders

## 2017-08-31 ENCOUNTER — Other Ambulatory Visit: Payer: Self-pay | Admitting: *Deleted

## 2017-08-31 MED ORDER — LISDEXAMFETAMINE DIMESYLATE 50 MG PO CAPS: 50.0000 mg | ORAL_CAPSULE | Freq: Every day | ORAL | 0 refills | Status: DC

## 2017-08-31 NOTE — Telephone Encounter (Signed)
Received call from patient.   Requested refill on Vyvanse.   Ok to refill??  Last office visit/  refill 07/13/2017.

## 2017-09-20 ENCOUNTER — Ambulatory Visit
Admission: RE | Admit: 2017-09-20 | Discharge: 2017-09-20 | Disposition: A | Discharge status: Home / Self Care | Payer: Medicaid Other | Source: Ambulatory Visit | Attending: Specialist | Admitting: Specialist

## 2017-09-20 DIAGNOSIS — M4807 Spinal stenosis, lumbosacral region: Secondary | ICD-10-CM

## 2017-09-20 DIAGNOSIS — M5136 Other intervertebral disc degeneration, lumbar region: Secondary | ICD-10-CM

## 2017-09-20 DIAGNOSIS — M4317 Spondylolisthesis, lumbosacral region: Secondary | ICD-10-CM

## 2017-09-22 ENCOUNTER — Telehealth (INDEPENDENT_AMBULATORY_CARE_PROVIDER_SITE_OTHER): Payer: Self-pay | Admitting: Specialist

## 2017-09-22 NOTE — Telephone Encounter (Signed)
Put pt on cancellation list.

## 2017-09-22 NOTE — Telephone Encounter (Signed)
Patient had her MRI on 4/23, can you add her to the cancellation list if anything opens before 10/10/17? # [phone]

## 2017-09-26 ENCOUNTER — Other Ambulatory Visit: Payer: Self-pay | Admitting: Family Medicine

## 2017-09-28 ENCOUNTER — Other Ambulatory Visit: Payer: Self-pay | Admitting: *Deleted

## 2017-09-28 MED ORDER — LISDEXAMFETAMINE DIMESYLATE 50 MG PO CAPS: 50.0000 mg | ORAL_CAPSULE | Freq: Every day | ORAL | 0 refills | Status: DC

## 2017-09-28 NOTE — Telephone Encounter (Signed)
Received call from patient.   Requested refill on Vyvanse.   Ok to refill??  Last office visit 07/13/2017.  Last refill 08/31/2017.

## 2017-09-29 ENCOUNTER — Encounter (INDEPENDENT_AMBULATORY_CARE_PROVIDER_SITE_OTHER): Payer: Self-pay | Admitting: Specialist

## 2017-09-29 ENCOUNTER — Ambulatory Visit (INDEPENDENT_AMBULATORY_CARE_PROVIDER_SITE_OTHER): Payer: Medicaid Other | Admitting: Specialist

## 2017-09-29 VITALS — BP 109/74 | HR 66 | Ht 62.0 in | Wt 190.0 lb

## 2017-09-29 DIAGNOSIS — M5136 Other intervertebral disc degeneration, lumbar region: Secondary | ICD-10-CM | POA: Diagnosis not present

## 2017-09-29 DIAGNOSIS — M48062 Spinal stenosis, lumbar region with neurogenic claudication: Secondary | ICD-10-CM | POA: Diagnosis not present

## 2017-09-29 DIAGNOSIS — M4316 Spondylolisthesis, lumbar region: Secondary | ICD-10-CM

## 2017-09-29 MED ORDER — HYDROCODONE-ACETAMINOPHEN 5-325 MG PO TABS: 1.0000 | ORAL_TABLET | Freq: Four times a day (QID) | ORAL | 0 refills | Status: DC | PRN

## 2017-09-29 NOTE — Patient Instructions (Addendum)
Avoid bending, stooping and avoid lifting weights greater than 10 lbs. Avoid prolong standing and walking. Order for a new walker with wheels. Surgery scheduling secretary Tivis Ringer, will call you in the next week to schedule for surgery.  Surgery recommended is a three level lumbar fusion right L3-4,  L4-5 and L5-S1 this would be done with rods, screws and cages with local bone graft and allograft (donor bone graft). Take gapapentin, tylenol for for pain, stop diclofenac 10 days prior to surgery. Risk of surgery includes risk of infection 1 in 300 patients, bleeding 1% chance you would need a transfusion.   Risk to the nerves is one in 10,000. You will need to use a brace for 3 months and wean from the brace on the 4th month. Expect improved walking and standing tolerance. Expect relief of leg pain but numbness may persist depending on the length and degree of pressure that has been present.  Hydrocodone for discomfort when off diclofenac.
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
## 2017-10-10 ENCOUNTER — Ambulatory Visit (INDEPENDENT_AMBULATORY_CARE_PROVIDER_SITE_OTHER): Payer: Medicaid Other | Admitting: Specialist

## 2017-10-25 ENCOUNTER — Other Ambulatory Visit: Payer: Self-pay | Admitting: *Deleted

## 2017-10-25 MED ORDER — LISDEXAMFETAMINE DIMESYLATE 50 MG PO CAPS: 50.0000 mg | ORAL_CAPSULE | Freq: Every day | ORAL | 0 refills | Status: DC

## 2017-10-25 NOTE — Telephone Encounter (Signed)
Received call from patient.   Requested refill on Vyvanse.   Ok to refill??  Last office visit 07/13/2017.  Last refill 09/28/2017.

## 2017-11-01 NOTE — Pre-Procedure Instructions (Addendum)
Rachel French  11/01/2017      [facility] [phone] - [place] - [address] AT [facility] [address] [facility] [address] [place] [id number] Phone: [phone] Fax: [phone]    Your procedure is scheduled on Tues., November 08, 2017 from 7:30AM-4:03PM  [facility]A" at 5:30AM  Call this number if you have problems the morning of surgery:  [phone]   Remember:  No food or drinks after midnight on June 10th    Take these medicines the morning of surgery with A SIP OF WATER: FLUoxetine (PROZAC). If needed Methocarbamol (ROBAXIN), HYDROcodone-acetaminophen (NORCO/VICODIN), and Olopatadine eye drops  Today, stop taking lisdexamfetamine (VYVANSE)   As of today, stop taking all Other Aspirin Products, Vitamins, Fish oils, and Herbal medications. Also stop all NSAIDS i.e. Advil, Ibuprofen, Motrin, Aleve, Anaprox, Naproxen, BC, Goody Powders, and all Supplements. Including: Diclofenac-miSOPROStol  How to Manage Your Diabetes Before and After Surgery  Why is it important to control my blood sugar before and after surgery? . Improving blood sugar levels before and after surgery helps healing and can limit problems. . A way of improving blood sugar control is eating a healthy diet by: o  Eating less sugar and carbohydrates o  Increasing activity/exercise o  Talking with your doctor about reaching your blood sugar goals . High blood sugars (greater than 180 mg/dL) can raise your risk of infections and slow your recovery, so you will need to focus on controlling your diabetes during the weeks before surgery. . Make sure that the doctor who takes care of your diabetes knows about your planned surgery including the date and location.  How do I manage my blood sugar before surgery? . Check your blood sugar at least 4 times a day, starting 2 days before surgery, to make sure that the level is not too high or low. o Check your  blood sugar the morning of your surgery when you wake up and every 2 hours until you get to the Short Stay unit. . If your blood sugar is less than 70 mg/dL, you will need to treat for low blood sugar: o Do not take insulin. o Treat a low blood sugar (less than 70 mg/dL) with  cup of clear juice (cranberry or apple), 4 glucose tablets, OR glucose gel. Recheck blood sugar in 15 minutes after treatment (to make sure it is greater than 70 mg/dL). If your blood sugar is not greater than 70 mg/dL on recheck, call [phone] o  for further instructions. . Report your blood sugar to the short stay nurse when you get to Short Stay.  . If you are admitted to the hospital after surgery: o Your blood sugar will be checked by the staff and you will probably be given insulin after surgery (instead of oral diabetes medicines) to make sure you have good blood sugar levels. o The goal for blood sugar control after surgery is 80-180 mg/dL  WHAT DO I DO ABOUT MY DIABETES MEDICATION?  Marland Kitchen. Do not take MetFORMIN (GLUCOPHAGE) the morning of surgery.  . If your CBG is greater than 220 mg/dL, inform the staff upon arrival.  Reviewed and Endorsed by [facility] Patient Education Committee, August 2015    Do not wear jewelry, make-up or nail polish (fingers).  Do not wear lotions, powders, or perfumes, or deodorant.  Do not shave 48 hours prior to surgery.    Do not  bring valuables to the hospital.  [facility] is not responsible for any belongings or valuables.  Contacts, dentures or bridgework may not be worn into surgery.  Leave your suitcase in the car.  After surgery it may be brought to your room.  For patients admitted to the hospital, discharge time will be determined by your treatment team.  Patients discharged the day of surgery will not be allowed to drive home.   Special instructions:  - Preparing For Surgery  Before surgery, you can play an important role. Because skin is not  sterile, your skin needs to be as free of germs as possible. You can reduce the number of germs on your skin by washing with CHG (chlorahexidine gluconate) Soap before surgery.  CHG is an antiseptic cleaner which kills germs and bonds with the skin to continue killing germs even after washing.    Oral Hygiene is also important to reduce your risk of infection.  Remember - BRUSH YOUR TEETH THE MORNING OF SURGERY WITH YOUR REGULAR TOOTHPASTE  Please do not use if you have an allergy to CHG or antibacterial soaps. If your skin becomes reddened/irritated stop using the CHG.  Do not shave (including legs and underarms) for at least 48 hours prior to first CHG shower. It is OK to shave your face.  Please follow these instructions carefully.   1. Shower the NIGHT BEFORE SURGERY and the MORNING OF SURGERY with CHG.   2. If you chose to wash your hair, wash your hair first as usual with your normal shampoo.  3. After you shampoo, rinse your hair and body thoroughly to remove the shampoo.  4. Use CHG as you would any other liquid soap. You can apply CHG directly to the skin and wash gently with a scrungie or a clean washcloth.   5. Apply the CHG Soap to your body ONLY FROM THE NECK DOWN.  Do not use on open wounds or open sores. Avoid contact with your eyes, ears, mouth and genitals (private parts). Wash Face and genitals (private parts)  with your normal soap.  6. Wash thoroughly, paying special attention to the area where your surgery will be performed.  7. Thoroughly rinse your body with warm water from the neck down.  8. DO NOT shower/wash with your normal soap after using and rinsing off the CHG Soap.  9. Pat yourself dry with a CLEAN TOWEL.  10. Wear CLEAN PAJAMAS to bed the night before surgery, wear comfortable clothes the morning of surgery  11. Place CLEAN SHEETS on your bed the night of your first shower and DO NOT SLEEP WITH PETS.  Day of Surgery:  Do not apply any  deodorants/lotions.  Please wear clean clothes to the hospital/surgery center.   Remember to brush your teeth WITH YOUR REGULAR TOOTHPASTE.  Please read over the following fact sheets that you were given. Pain Booklet, Coughing and Deep Breathing, MRSA Information and Surgical Site Infection Prevention

## 2017-11-02 ENCOUNTER — Encounter (HOSPITAL_COMMUNITY)
Admission: RE | Admit: 2017-11-02 | Discharge: 2017-11-02 | Disposition: A | Discharge status: Home / Self Care | Payer: Medicaid Other | Source: Ambulatory Visit | Attending: Specialist | Admitting: Specialist

## 2017-11-02 ENCOUNTER — Other Ambulatory Visit: Payer: Self-pay

## 2017-11-02 ENCOUNTER — Encounter (HOSPITAL_COMMUNITY): Payer: Self-pay

## 2017-11-02 DIAGNOSIS — Z01812 Encounter for preprocedural laboratory examination: Secondary | ICD-10-CM | POA: Diagnosis present

## 2017-11-02 HISTORY — DX: Spinal stenosis, lumbar region without neurogenic claudication: M48.061

## 2017-11-02 LAB — CBC
HCT: 38 % (ref 36.0–46.0)
HEMOGLOBIN: 12.5 g/dL (ref 12.0–15.0)
MCH: 31.4 pg (ref 26.0–34.0)
MCHC: 32.9 g/dL (ref 30.0–36.0)
MCV: 95.5 fL (ref 78.0–100.0)
Platelets: 360 10*3/uL (ref 150–400)
RBC: 3.98 MIL/uL (ref 3.87–5.11)
RDW: 14.6 % (ref 11.5–15.5)
WBC: 13.1 10*3/uL — ABNORMAL HIGH (ref 4.0–10.5)

## 2017-11-02 LAB — URINALYSIS, ROUTINE W REFLEX MICROSCOPIC
BACTERIA UA: NONE SEEN
Bilirubin Urine: NEGATIVE
Glucose, UA: NEGATIVE mg/dL
Ketones, ur: NEGATIVE mg/dL
Leukocytes, UA: NEGATIVE
Nitrite: NEGATIVE
PH: 5 (ref 5.0–8.0)
Protein, ur: NEGATIVE mg/dL
SPECIFIC GRAVITY, URINE: 1.02 (ref 1.005–1.030)

## 2017-11-02 LAB — COMPREHENSIVE METABOLIC PANEL
ALBUMIN: 3.8 g/dL (ref 3.5–5.0)
ALK PHOS: 71 U/L (ref 38–126)
ALT: 24 U/L (ref 14–54)
AST: 25 U/L (ref 15–41)
Anion gap: 8 (ref 5–15)
BUN: 9 mg/dL (ref 6–20)
CO2: 23 mmol/L (ref 22–32)
CREATININE: 0.64 mg/dL (ref 0.44–1.00)
Calcium: 8.9 mg/dL (ref 8.9–10.3)
Chloride: 105 mmol/L (ref 101–111)
GFR calc Af Amer: >60 mL/min (ref >60)
GFR calc non Af Amer: >60 mL/min (ref >60)
Glucose, Bld: 102 mg/dL — ABNORMAL HIGH (ref 65–99)
Potassium: 4.1 mmol/L (ref 3.5–5.1)
SODIUM: 136 mmol/L (ref 135–145)
TOTAL PROTEIN: 7.3 g/dL (ref 6.5–8.1)
Total Bilirubin: 0.6 mg/dL (ref 0.3–1.2)

## 2017-11-02 LAB — ABO/RH: ABO/RH(D): O POS

## 2017-11-02 LAB — PROTIME-INR
INR: 0.98
Prothrombin Time: 12.9 seconds (ref 11.4–15.2)

## 2017-11-02 LAB — SURGICAL PCR SCREEN
MRSA, PCR: POSITIVE — AB
STAPHYLOCOCCUS AUREUS: POSITIVE — AB

## 2017-11-02 LAB — APTT: aPTT: 27 seconds (ref 24–36)

## 2017-11-02 NOTE — Progress Notes (Signed)
PCP - Dr. Milinda AntisKawanta Homestead  Cardiologist - Denies  Chest x-ray - Denies  EKG - Denies  Stress Test - Denies  ECHO - Denies  Cardiac Cath - Denies  Sleep Study - Denies CPAP - None  LABS- 11/02/17: CBC, CMP, PT, PTT, T/S, UA POC Upreg: 11/08/17  ASA- Denies  Pt sts she is not a diabetic. She sts she takes metformin for PCOS.    Anesthesia-No  Pt denies having chest pain, sob, or fever at this time. All instructions explained to the pt, with a verbal understanding of the material. Pt agrees to go over the instructions while at home for a better understanding. The opportunity to ask questions was provided.

## 2017-11-02 NOTE — Progress Notes (Signed)
Mupirocin Ointment Rx called into [facility] on [address] for positive PCR of MRSA and Staph. Pt notified and voiced understanding.

## 2017-11-03 ENCOUNTER — Ambulatory Visit (INDEPENDENT_AMBULATORY_CARE_PROVIDER_SITE_OTHER): Payer: Medicaid Other | Admitting: Surgery

## 2017-11-03 ENCOUNTER — Ambulatory Visit (INDEPENDENT_AMBULATORY_CARE_PROVIDER_SITE_OTHER): Payer: Medicaid Other | Admitting: Specialist

## 2017-11-03 VITALS — BP 123/85 | HR 65 | Ht 61.0 in | Wt 205.0 lb

## 2017-11-03 DIAGNOSIS — M4316 Spondylolisthesis, lumbar region: Secondary | ICD-10-CM | POA: Diagnosis not present

## 2017-11-03 DIAGNOSIS — M48062 Spinal stenosis, lumbar region with neurogenic claudication: Secondary | ICD-10-CM | POA: Diagnosis not present

## 2017-11-07 MED ORDER — VANCOMYCIN HCL IN DEXTROSE 1-5 GM/200ML-% IV SOLN
1000.0000 mg | INTRAVENOUS | Status: AC
  Administered 2017-11-08: 1000 mg via INTRAVENOUS
  Filled 2017-11-07: qty 200

## 2017-11-07 MED ORDER — CEFAZOLIN SODIUM-DEXTROSE 2-4 GM/100ML-% IV SOLN
2.0000 g | INTRAVENOUS | Status: AC
  Administered 2017-11-08 (×2): 2 g via INTRAVENOUS
  Filled 2017-11-07: qty 100
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
## 2017-11-07 NOTE — Anesthesia Preprocedure Evaluation (Addendum)
Anesthesia Evaluation  Patient identified by MRN, date of birth, ID band Patient awake    Reviewed: Allergy & Precautions, NPO status , Patient's Chart, lab work & pertinent test results  History of Anesthesia Complications (+) PONV and history of anesthetic complications  Airway Mallampati: I  TM Distance: >3 FB Neck ROM: Full    Dental  (+) Teeth Intact   Pulmonary    breath sounds clear to auscultation       Cardiovascular negative cardio ROS   Rhythm:Regular Rate:Normal     Neuro/Psych Bipolar Disorder  Neuromuscular disease    GI/Hepatic Neg liver ROS, PUD,   Endo/Other  negative endocrine ROSMorbid obesity  Renal/GU negative Renal ROS     Musculoskeletal  (+) Arthritis ,   Abdominal (+) + obese,   Peds  Hematology   Anesthesia Other Findings   Reproductive/Obstetrics                            Anesthesia Physical Anesthesia Plan  ASA: III  Anesthesia Plan: General   Post-op Pain Management:    Induction: Intravenous  PONV Risk Score and Plan: 4 or greater and Dexamethasone, Ondansetron and Treatment may vary due to age or medical condition  Airway Management Planned: Oral ETT  Additional Equipment:   Intra-op Plan:   Post-operative Plan: Extubation in OR  Informed Consent: I have reviewed the patients History and Physical, chart, labs and discussed the procedure including the risks, benefits and alternatives for the proposed anesthesia with the patient or authorized representative who has indicated his/her understanding and acceptance.   Dental advisory given  Plan Discussed with: CRNA  Anesthesia Plan Comments:         Anesthesia Quick Evaluation

## 2017-11-07 NOTE — Progress Notes (Signed)
42 yo female with hx of lumbar stenosis came in for preop evaluation.  Full H&P performed and placed in patients chart.

## 2017-11-08 ENCOUNTER — Inpatient Hospital Stay (HOSPITAL_COMMUNITY): Payer: Medicaid Other | Admitting: Anesthesiology

## 2017-11-08 ENCOUNTER — Encounter (HOSPITAL_COMMUNITY)
Admission: RE | Disposition: A | Discharge status: Home Health | Payer: Self-pay | Source: Ambulatory Visit | Attending: Specialist

## 2017-11-08 ENCOUNTER — Other Ambulatory Visit: Payer: Self-pay

## 2017-11-08 ENCOUNTER — Encounter (HOSPITAL_COMMUNITY): Payer: Self-pay

## 2017-11-08 ENCOUNTER — Inpatient Hospital Stay (HOSPITAL_COMMUNITY): Payer: Medicaid Other

## 2017-11-08 ENCOUNTER — Inpatient Hospital Stay (HOSPITAL_COMMUNITY)
Admission: RE | Admit: 2017-11-08 | Discharge: 2017-11-11 | DRG: 454 | Disposition: A | Discharge status: Home Health | Payer: Medicaid Other | Source: Ambulatory Visit | Attending: Specialist | Admitting: Specialist

## 2017-11-08 DIAGNOSIS — M4317 Spondylolisthesis, lumbosacral region: Secondary | ICD-10-CM | POA: Diagnosis present

## 2017-11-08 DIAGNOSIS — F319 Bipolar disorder, unspecified: Secondary | ICD-10-CM | POA: Diagnosis present

## 2017-11-08 DIAGNOSIS — K219 Gastro-esophageal reflux disease without esophagitis: Secondary | ICD-10-CM | POA: Diagnosis present

## 2017-11-08 DIAGNOSIS — M48062 Spinal stenosis, lumbar region with neurogenic claudication: Secondary | ICD-10-CM

## 2017-11-08 DIAGNOSIS — M545 Low back pain: Secondary | ICD-10-CM | POA: Diagnosis present

## 2017-11-08 DIAGNOSIS — Z981 Arthrodesis status: Secondary | ICD-10-CM

## 2017-11-08 DIAGNOSIS — M5136 Other intervertebral disc degeneration, lumbar region: Secondary | ICD-10-CM

## 2017-11-08 DIAGNOSIS — M4316 Spondylolisthesis, lumbar region: Secondary | ICD-10-CM

## 2017-11-08 DIAGNOSIS — K589 Irritable bowel syndrome without diarrhea: Secondary | ICD-10-CM | POA: Diagnosis present

## 2017-11-08 DIAGNOSIS — M5116 Intervertebral disc disorders with radiculopathy, lumbar region: Secondary | ICD-10-CM | POA: Diagnosis present

## 2017-11-08 DIAGNOSIS — Z419 Encounter for procedure for purposes other than remedying health state, unspecified: Secondary | ICD-10-CM

## 2017-11-08 DIAGNOSIS — D62 Acute posthemorrhagic anemia: Secondary | ICD-10-CM | POA: Diagnosis not present

## 2017-11-08 HISTORY — DX: Personal history of other medical treatment: Z92.89

## 2017-11-08 HISTORY — DX: Low back pain, unspecified: M54.50

## 2017-11-08 HISTORY — DX: Headache: R51

## 2017-11-08 HISTORY — DX: Depression, unspecified: F32.A

## 2017-11-08 HISTORY — DX: Low back pain: M54.5

## 2017-11-08 HISTORY — DX: Bipolar disorder, unspecified: F31.9

## 2017-11-08 HISTORY — DX: Other chronic pain: G89.29

## 2017-11-08 HISTORY — PX: MAXIMUM ACCESS (MAS) TRANSFORAMINAL LUMBAR INTERBODY FUSION (TLIF) 3 LEVEL: SHX6394

## 2017-11-08 HISTORY — DX: Cardiac arrhythmia, unspecified: I49.9

## 2017-11-08 HISTORY — DX: Major depressive disorder, single episode, unspecified: F32.9

## 2017-11-08 HISTORY — DX: Headache, unspecified: R51.9

## 2017-11-08 HISTORY — DX: Gastro-esophageal reflux disease without esophagitis: K21.9

## 2017-11-08 LAB — POCT PREGNANCY, URINE: Preg Test, Ur: NEGATIVE

## 2017-11-08 LAB — HEMOGLOBIN A1C
Hgb A1c MFr Bld: 5.7 % — ABNORMAL HIGH (ref 4.8–5.6)
Mean Plasma Glucose: 116.89 mg/dL

## 2017-11-08 LAB — GLUCOSE, CAPILLARY
Glucose-Capillary: 212 mg/dL — ABNORMAL HIGH (ref 65–99)
Glucose-Capillary: 173 mg/dL — ABNORMAL HIGH (ref 65–99)

## 2017-11-08 SURGERY — POSTERIOR LUMBAR FUSION 3 LEVEL
Anesthesia: General | Site: Back

## 2017-11-08 MED ORDER — VALACYCLOVIR HCL 500 MG PO TABS: 500.0000 mg | ORAL_TABLET | Freq: Two times a day (BID) | ORAL | Status: DC

## 2017-11-08 MED ORDER — BUPIVACAINE HCL 0.5 % IJ SOLN
INTRAMUSCULAR | Status: DC | PRN
  Administered 2017-11-08: 14 mL

## 2017-11-08 MED ORDER — PROPOFOL 10 MG/ML IV BOLUS
INTRAVENOUS | Status: AC
  Filled 2017-11-08: qty 20

## 2017-11-08 MED ORDER — DOCUSATE SODIUM 100 MG PO CAPS
100.0000 mg | ORAL_CAPSULE | Freq: Two times a day (BID) | ORAL | Status: DC
  Administered 2017-11-08 – 2017-11-11 (×6): 100 mg via ORAL
  Filled 2017-11-08 (×6): qty 1

## 2017-11-08 MED ORDER — TRIAMCINOLONE ACETONIDE 0.5 % EX CREA: TOPICAL_CREAM | Freq: Two times a day (BID) | CUTANEOUS | Status: DC

## 2017-11-08 MED ORDER — BUPIVACAINE LIPOSOME 1.3 % IJ SUSP
20.0000 mL | INTRAMUSCULAR | Status: AC
  Administered 2017-11-08: 14 mL
  Filled 2017-11-08: qty 20

## 2017-11-08 MED ORDER — ESMOLOL HCL 100 MG/10ML IV SOLN
INTRAVENOUS | Status: DC | PRN
  Administered 2017-11-08: 20 mg via INTRAVENOUS

## 2017-11-08 MED ORDER — PHENYLEPHRINE 40 MCG/ML (10ML) SYRINGE FOR IV PUSH (FOR BLOOD PRESSURE SUPPORT)
PREFILLED_SYRINGE | INTRAVENOUS | Status: DC | PRN
  Administered 2017-11-08: 80 ug via INTRAVENOUS
  Administered 2017-11-08: 40 ug via INTRAVENOUS
  Administered 2017-11-08: 80 ug via INTRAVENOUS
  Administered 2017-11-08: 120 ug via INTRAVENOUS
  Administered 2017-11-08: 160 ug via INTRAVENOUS
  Administered 2017-11-08: 80 ug via INTRAVENOUS

## 2017-11-08 MED ORDER — SUGAMMADEX SODIUM 200 MG/2ML IV SOLN
INTRAVENOUS | Status: AC
  Filled 2017-11-08: qty 2

## 2017-11-08 MED ORDER — SODIUM CHLORIDE 0.9 % IV SOLN
INTRAVENOUS | Status: DC | PRN
  Administered 2017-11-08: 14:00:00 via INTRAVENOUS

## 2017-11-08 MED ORDER — 0.9 % SODIUM CHLORIDE (POUR BTL) OPTIME
TOPICAL | Status: DC | PRN
  Administered 2017-11-08: 1000 mL

## 2017-11-08 MED ORDER — OXYCODONE HCL 5 MG PO TABS
5.0000 mg | ORAL_TABLET | ORAL | Status: DC | PRN
Start: 2017-11-08 — End: 2017-11-11

## 2017-11-08 MED ORDER — MIDAZOLAM HCL 2 MG/2ML IJ SOLN
INTRAMUSCULAR | Status: DC | PRN
  Administered 2017-11-08: 2 mg via INTRAVENOUS

## 2017-11-08 MED ORDER — LACTATED RINGERS IV SOLN
INTRAVENOUS | Status: DC | PRN
  Administered 2017-11-08 (×2): via INTRAVENOUS

## 2017-11-08 MED ORDER — ACETAMINOPHEN 650 MG RE SUPP: 650.0000 mg | RECTAL | Status: DC | PRN

## 2017-11-08 MED ORDER — ONDANSETRON HCL 4 MG/2ML IJ SOLN
INTRAMUSCULAR | Status: DC | PRN
  Administered 2017-11-08: 4 mg via INTRAVENOUS

## 2017-11-08 MED ORDER — FENTANYL CITRATE (PF) 250 MCG/5ML IJ SOLN
INTRAMUSCULAR | Status: AC
  Filled 2017-11-08: qty 5

## 2017-11-08 MED ORDER — ACETAMINOPHEN 325 MG PO TABS
650.0000 mg | ORAL_TABLET | ORAL | Status: DC | PRN
  Administered 2017-11-08 – 2017-11-10 (×2): 650 mg via ORAL
  Filled 2017-11-08: qty 2

## 2017-11-08 MED ORDER — FENTANYL CITRATE (PF) 100 MCG/2ML IJ SOLN
INTRAMUSCULAR | Status: DC | PRN
  Administered 2017-11-08 (×4): 50 ug via INTRAVENOUS
  Administered 2017-11-08: 150 ug via INTRAVENOUS
  Administered 2017-11-08 (×3): 50 ug via INTRAVENOUS

## 2017-11-08 MED ORDER — ONDANSETRON HCL 4 MG PO TABS: 4.0000 mg | ORAL_TABLET | Freq: Four times a day (QID) | ORAL | Status: DC | PRN

## 2017-11-08 MED ORDER — FLEET ENEMA 7-19 GM/118ML RE ENEM: 1.0000 | ENEMA | Freq: Once | RECTAL | Status: DC | PRN

## 2017-11-08 MED ORDER — ARTIFICIAL TEARS OPHTHALMIC OINT
TOPICAL_OINTMENT | OPHTHALMIC | Status: DC | PRN
  Administered 2017-11-08: 1 via OPHTHALMIC

## 2017-11-08 MED ORDER — DEXAMETHASONE SODIUM PHOSPHATE 10 MG/ML IJ SOLN
INTRAMUSCULAR | Status: AC
  Filled 2017-11-08: qty 1

## 2017-11-08 MED ORDER — SODIUM CHLORIDE 0.9 % IJ SOLN
INTRAMUSCULAR | Status: AC
  Filled 2017-11-08: qty 10

## 2017-11-08 MED ORDER — SODIUM CHLORIDE 0.9% FLUSH: 3.0000 mL | INTRAVENOUS | Status: DC | PRN

## 2017-11-08 MED ORDER — OXYCODONE HCL 5 MG PO TABS
5.0000 mg | ORAL_TABLET | Freq: Once | ORAL | Status: AC | PRN
  Administered 2017-11-08: 5 mg via ORAL

## 2017-11-08 MED ORDER — BUPIVACAINE HCL (PF) 0.5 % IJ SOLN
INTRAMUSCULAR | Status: AC
  Filled 2017-11-08: qty 30

## 2017-11-08 MED ORDER — POLYETHYLENE GLYCOL 3350 17 G PO PACK: 17.0000 g | PACK | Freq: Every day | ORAL | Status: DC | PRN

## 2017-11-08 MED ORDER — BISACODYL 5 MG PO TBEC: 5.0000 mg | DELAYED_RELEASE_TABLET | Freq: Every day | ORAL | Status: DC | PRN

## 2017-11-08 MED ORDER — SODIUM CHLORIDE 0.9 % IV SOLN: 250.0000 mL | INTRAVENOUS | Status: DC

## 2017-11-08 MED ORDER — GABAPENTIN 100 MG PO CAPS
100.0000 mg | ORAL_CAPSULE | Freq: Three times a day (TID) | ORAL | Status: DC
  Administered 2017-11-08 – 2017-11-10 (×5): 100 mg via ORAL
  Filled 2017-11-08 (×5): qty 1

## 2017-11-08 MED ORDER — FENTANYL CITRATE (PF) 250 MCG/5ML IJ SOLN
INTRAMUSCULAR | Status: AC
Start: 2017-11-08 — End: ?
  Filled 2017-11-08: qty 5

## 2017-11-08 MED ORDER — KETOROLAC TROMETHAMINE 15 MG/ML IJ SOLN
15.0000 mg | Freq: Four times a day (QID) | INTRAMUSCULAR | Status: AC
  Administered 2017-11-08 – 2017-11-09 (×4): 15 mg via INTRAVENOUS
  Filled 2017-11-08 (×4): qty 1

## 2017-11-08 MED ORDER — THROMBIN 20000 UNITS EX SOLR
CUTANEOUS | Status: AC
  Filled 2017-11-08: qty 40000

## 2017-11-08 MED ORDER — ALUM & MAG HYDROXIDE-SIMETH 200-200-20 MG/5ML PO SUSP: 30.0000 mL | Freq: Four times a day (QID) | ORAL | Status: DC | PRN

## 2017-11-08 MED ORDER — PROPOFOL 10 MG/ML IV BOLUS
INTRAVENOUS | Status: DC | PRN
  Administered 2017-11-08: 170 mg via INTRAVENOUS

## 2017-11-08 MED ORDER — LORATADINE 10 MG PO TABS
10.0000 mg | ORAL_TABLET | Freq: Every day | ORAL | Status: DC
Start: 2017-11-09 — End: 2017-11-11
  Administered 2017-11-09 – 2017-11-11 (×3): 10 mg via ORAL
  Filled 2017-11-08 (×3): qty 1

## 2017-11-08 MED ORDER — METHOCARBAMOL 500 MG PO TABS
500.0000 mg | ORAL_TABLET | Freq: Four times a day (QID) | ORAL | Status: DC | PRN
  Administered 2017-11-08 – 2017-11-11 (×7): 500 mg via ORAL
  Filled 2017-11-08 (×7): qty 1

## 2017-11-08 MED ORDER — MIDAZOLAM HCL 2 MG/2ML IJ SOLN
INTRAMUSCULAR | Status: AC
  Filled 2017-11-08: qty 2

## 2017-11-08 MED ORDER — FLUOXETINE HCL 20 MG PO CAPS
40.0000 mg | ORAL_CAPSULE | Freq: Every day | ORAL | Status: DC
  Administered 2017-11-09 – 2017-11-11 (×3): 40 mg via ORAL
  Filled 2017-11-08 (×3): qty 2

## 2017-11-08 MED ORDER — OXYCODONE HCL 5 MG/5ML PO SOLN: 5.0000 mg | Freq: Once | ORAL | Status: AC | PRN

## 2017-11-08 MED ORDER — ONDANSETRON HCL 4 MG/2ML IJ SOLN
INTRAMUSCULAR | Status: AC
  Filled 2017-11-08: qty 2

## 2017-11-08 MED ORDER — SUGAMMADEX SODIUM 200 MG/2ML IV SOLN
INTRAVENOUS | Status: DC | PRN
  Administered 2017-11-08: 200 mg via INTRAVENOUS

## 2017-11-08 MED ORDER — PHENYLEPHRINE HCL 10 MG/ML IJ SOLN
INTRAVENOUS | Status: DC | PRN
  Administered 2017-11-08: 10 ug/min via INTRAVENOUS

## 2017-11-08 MED ORDER — OLOPATADINE HCL 0.1 % OP SOLN
1.0000 [drp] | Freq: Two times a day (BID) | OPHTHALMIC | Status: DC
  Administered 2017-11-08 – 2017-11-11 (×4): 1 [drp] via OPHTHALMIC
  Filled 2017-11-08: qty 5

## 2017-11-08 MED ORDER — LIDOCAINE 2% (20 MG/ML) 5 ML SYRINGE
INTRAMUSCULAR | Status: DC | PRN
  Administered 2017-11-08: 40 mg via INTRAVENOUS

## 2017-11-08 MED ORDER — THROMBIN 20000 UNITS EX KIT
PACK | CUTANEOUS | Status: AC
  Filled 2017-11-08: qty 1

## 2017-11-08 MED ORDER — METHOCARBAMOL 1000 MG/10ML IJ SOLN
500.0000 mg | Freq: Four times a day (QID) | INTRAVENOUS | Status: DC | PRN
  Filled 2017-11-08: qty 5

## 2017-11-08 MED ORDER — SUGAMMADEX SODIUM 200 MG/2ML IV SOLN
INTRAVENOUS | Status: AC
Start: 2017-11-08 — End: ?
  Filled 2017-11-08: qty 2

## 2017-11-08 MED ORDER — HYDROMORPHONE HCL 2 MG/ML IJ SOLN
0.5000 mg | INTRAMUSCULAR | Status: DC | PRN
  Administered 2017-11-10 – 2017-11-11 (×2): 0.5 mg via INTRAVENOUS
  Filled 2017-11-08 (×2): qty 1

## 2017-11-08 MED ORDER — OXYCODONE HCL ER 15 MG PO T12A
15.0000 mg | EXTENDED_RELEASE_TABLET | Freq: Two times a day (BID) | ORAL | Status: DC
  Administered 2017-11-08 – 2017-11-11 (×6): 15 mg via ORAL
  Filled 2017-11-08 (×7): qty 1

## 2017-11-08 MED ORDER — PHENOL 1.4 % MT LIQD: 1.0000 | OROMUCOSAL | Status: DC | PRN

## 2017-11-08 MED ORDER — ONDANSETRON HCL 4 MG/2ML IJ SOLN
4.0000 mg | Freq: Four times a day (QID) | INTRAMUSCULAR | Status: DC | PRN
  Administered 2017-11-08: 4 mg via INTRAVENOUS
  Filled 2017-11-08: qty 2

## 2017-11-08 MED ORDER — LACTATED RINGERS IV SOLN
INTRAVENOUS | Status: DC
  Administered 2017-11-08 (×3): via INTRAVENOUS

## 2017-11-08 MED ORDER — EPHEDRINE SULFATE-NACL 50-0.9 MG/10ML-% IV SOSY
PREFILLED_SYRINGE | INTRAVENOUS | Status: DC | PRN
  Administered 2017-11-08 (×3): 5 mg via INTRAVENOUS

## 2017-11-08 MED ORDER — MENTHOL 3 MG MT LOZG: 1.0000 | LOZENGE | OROMUCOSAL | Status: DC | PRN

## 2017-11-08 MED ORDER — METHOCARBAMOL 500 MG PO TABS
ORAL_TABLET | ORAL | Status: AC
  Filled 2017-11-08: qty 1

## 2017-11-08 MED ORDER — ROCURONIUM BROMIDE 100 MG/10ML IV SOLN
INTRAVENOUS | Status: DC | PRN
  Administered 2017-11-08: 10 mg via INTRAVENOUS
  Administered 2017-11-08 (×5): 20 mg via INTRAVENOUS
  Administered 2017-11-08: 60 mg via INTRAVENOUS

## 2017-11-08 MED ORDER — THROMBIN 20000 UNITS EX SOLR
CUTANEOUS | Status: DC | PRN
  Administered 2017-11-08: 20 mL via TOPICAL

## 2017-11-08 MED ORDER — HEMOSTATIC AGENTS (NO CHARGE) OPTIME
TOPICAL | Status: DC | PRN
  Administered 2017-11-08: 2 via TOPICAL

## 2017-11-08 MED ORDER — SODIUM CHLORIDE 0.9 % IV SOLN
INTRAVENOUS | Status: DC
  Administered 2017-11-08: 18:00:00 via INTRAVENOUS

## 2017-11-08 MED ORDER — HYDROMORPHONE HCL 2 MG/ML IJ SOLN
0.2500 mg | INTRAMUSCULAR | Status: DC | PRN
  Administered 2017-11-08 (×2): 0.5 mg via INTRAVENOUS

## 2017-11-08 MED ORDER — SODIUM CHLORIDE 0.9% FLUSH
3.0000 mL | Freq: Two times a day (BID) | INTRAVENOUS | Status: DC
  Administered 2017-11-10 – 2017-11-11 (×2): 3 mL via INTRAVENOUS

## 2017-11-08 MED ORDER — SURGIFOAM 100 EX MISC: CUTANEOUS | Status: DC | PRN

## 2017-11-08 MED ORDER — DEXAMETHASONE SODIUM PHOSPHATE 10 MG/ML IJ SOLN
INTRAMUSCULAR | Status: DC | PRN
  Administered 2017-11-08: 10 mg via INTRAVENOUS

## 2017-11-08 MED ORDER — OXYCODONE HCL 5 MG PO TABS
ORAL_TABLET | ORAL | Status: AC
  Filled 2017-11-08: qty 1

## 2017-11-08 MED ORDER — CEFAZOLIN SODIUM 1 G IJ SOLR
INTRAMUSCULAR | Status: AC
  Filled 2017-11-08: qty 20

## 2017-11-08 MED ORDER — OXYCODONE HCL 5 MG PO TABS
10.0000 mg | ORAL_TABLET | ORAL | Status: DC | PRN
  Administered 2017-11-08 – 2017-11-11 (×10): 10 mg via ORAL
  Filled 2017-11-08 (×11): qty 2

## 2017-11-08 MED ORDER — VANCOMYCIN HCL 10 G IV SOLR
1250.0000 mg | Freq: Once | INTRAVENOUS | Status: AC
  Administered 2017-11-08: 1250 mg via INTRAVENOUS
  Filled 2017-11-08: qty 1250

## 2017-11-08 MED ORDER — ESMOLOL HCL 100 MG/10ML IV SOLN
INTRAVENOUS | Status: AC
  Filled 2017-11-08: qty 10

## 2017-11-08 MED ORDER — HYDROMORPHONE HCL 2 MG/ML IJ SOLN
INTRAMUSCULAR | Status: AC
  Filled 2017-11-08: qty 1

## 2017-11-08 MED ORDER — CHLORHEXIDINE GLUCONATE 4 % EX LIQD: 60.0000 mL | Freq: Once | CUTANEOUS | Status: DC

## 2017-11-08 MED ORDER — INSULIN ASPART 100 UNIT/ML ~~LOC~~ SOLN
0.0000 [IU] | Freq: Three times a day (TID) | SUBCUTANEOUS | Status: DC
  Administered 2017-11-08: 5 [IU] via SUBCUTANEOUS

## 2017-11-08 MED ORDER — ALBUMIN HUMAN 5 % IV SOLN
INTRAVENOUS | Status: DC | PRN
  Administered 2017-11-08: 13:00:00 via INTRAVENOUS

## 2017-11-08 SURGICAL SUPPLY — 77 items
BENZOIN TINCTURE PRP APPL 2/3 (GAUZE/BANDAGES/DRESSINGS) ×3 IMPLANT
BLADE CLIPPER SURG (BLADE) IMPLANT
BONE CANC CHIPS 20CC PCAN1/4 (Bone Implant) ×3 IMPLANT
BONE VIVIGEN FORMABLE 10CC (Bone Implant) ×3 IMPLANT
BUR MATCHSTICK NEURO 3.0 LAGG (BURR) ×3 IMPLANT
BUR NEURO DRILL SOFT 3.0X3.8M (BURR) IMPLANT
BUR PRESCISION 1.7 ELITE (BURR) IMPLANT
BUR RND FLUTED 2.5 (BURR) IMPLANT
CAGE CONCORDE LOR 9X10X23 5D (Cage) ×3 IMPLANT
CHIPS CANC BONE 20CC PCAN1/4 (Bone Implant) ×1 IMPLANT
CLOSURE STERI-STRIP 1/2X4 (GAUZE/BANDAGES/DRESSINGS) ×1
CLSR STERI-STRIP ANTIMIC 1/2X4 (GAUZE/BANDAGES/DRESSINGS) ×2 IMPLANT
COVER BACK TABLE 80X110 HD (DRAPES) ×3 IMPLANT
COVER MAYO STAND STRL (DRAPES) IMPLANT
COVER SURGICAL LIGHT HANDLE (MISCELLANEOUS) ×3 IMPLANT
DERMABOND ADVANCED (GAUZE/BANDAGES/DRESSINGS) ×2
DERMABOND ADVANCED .7 DNX12 (GAUZE/BANDAGES/DRESSINGS) ×1 IMPLANT
DRAPE C-ARM 42X72 X-RAY (DRAPES) ×3 IMPLANT
DRAPE C-ARMOR (DRAPES) ×3 IMPLANT
DRAPE MICROSCOPE LEICA (MISCELLANEOUS) ×3 IMPLANT
DRAPE POUCH INSTRU U-SHP 10X18 (DRAPES) ×3 IMPLANT
DRAPE SURG 17X23 STRL (DRAPES) ×12 IMPLANT
DRSG MEPILEX BORDER 4X8 (GAUZE/BANDAGES/DRESSINGS) ×3 IMPLANT
DURAPREP 26ML APPLICATOR (WOUND CARE) ×3 IMPLANT
ELECT BLADE 4.0 EZ CLEAN MEGAD (MISCELLANEOUS) ×3
ELECT BLADE 6.5 EXT (BLADE) IMPLANT
ELECT CAUTERY BLADE 6.4 (BLADE) ×3 IMPLANT
ELECT REM PT RETURN 9FT ADLT (ELECTROSURGICAL) ×3
ELECTRODE BLDE 4.0 EZ CLN MEGD (MISCELLANEOUS) ×1 IMPLANT
ELECTRODE REM PT RTRN 9FT ADLT (ELECTROSURGICAL) ×1 IMPLANT
EVACUATOR 1/8 PVC DRAIN (DRAIN) IMPLANT
FLOSEAL 5ML (HEMOSTASIS) ×6 IMPLANT
GLOVE BIOGEL PI IND STRL 8 (GLOVE) ×2 IMPLANT
GLOVE BIOGEL PI INDICATOR 8 (GLOVE) ×4
GLOVE ECLIPSE 9.0 STRL (GLOVE) ×6 IMPLANT
GLOVE ORTHO TXT STRL SZ7.5 (GLOVE) ×6 IMPLANT
GLOVE SURG 8.5 LATEX PF (GLOVE) ×6 IMPLANT
GOWN STRL REUS W/ TWL LRG LVL3 (GOWN DISPOSABLE) IMPLANT
GOWN STRL REUS W/TWL 2XL LVL3 (GOWN DISPOSABLE) ×12 IMPLANT
GOWN STRL REUS W/TWL LRG LVL3 (GOWN DISPOSABLE)
KIT BASIN OR (CUSTOM PROCEDURE TRAY) ×3 IMPLANT
KIT POSITION SURG JACKSON T1 (MISCELLANEOUS) ×3 IMPLANT
KIT TURNOVER KIT B (KITS) ×3 IMPLANT
NEEDLE 22X1 1/2 (OR ONLY) (NEEDLE) ×3 IMPLANT
NEEDLE SPNL 18GX3.5 QUINCKE PK (NEEDLE) ×3 IMPLANT
NS IRRIG 1000ML POUR BTL (IV SOLUTION) ×3 IMPLANT
PACK LAMINECTOMY ORTHO (CUSTOM PROCEDURE TRAY) ×3 IMPLANT
PAD ARMBOARD 7.5X6 YLW CONV (MISCELLANEOUS) ×6 IMPLANT
PATTIES SURGICAL .75X.75 (GAUZE/BANDAGES/DRESSINGS) ×3 IMPLANT
PATTIES SURGICAL 1X1 (DISPOSABLE) ×3 IMPLANT
ROD EXPEDIUM PREBENT 85MM (Rod) ×6 IMPLANT
SCREW CORT FIX FEN 5.5X7X35MM (Screw) ×3 IMPLANT
SCREW CORT FIX FEN 5.5X7X40MM (Screw) ×15 IMPLANT
SCREW SET SINGLE INNER (Screw) ×24 IMPLANT
SCREW VIPER 7X45MM (Screw) ×6 IMPLANT
SPACER CONCORDE PRO 5D 9X9X23 (Spacer) ×6 IMPLANT
SPONGE LAP 4X18 RFD (DISPOSABLE) ×3 IMPLANT
SPONGE SURGIFOAM ABS GEL 100 (HEMOSTASIS) ×3 IMPLANT
SURGIFLO W/THROMBIN 8M KIT (HEMOSTASIS) IMPLANT
SUT VIC AB 0 CT1 27 (SUTURE) ×2
SUT VIC AB 0 CT1 27XBRD ANBCTR (SUTURE) ×1 IMPLANT
SUT VIC AB 1 CTX 36 (SUTURE) ×2
SUT VIC AB 1 CTX36XBRD ANBCTR (SUTURE) ×1 IMPLANT
SUT VIC AB 2-0 CT1 27 (SUTURE) ×2
SUT VIC AB 2-0 CT1 TAPERPNT 27 (SUTURE) ×1 IMPLANT
SUT VIC AB 3-0 X1 27 (SUTURE) ×3 IMPLANT
SYR 20CC LL (SYRINGE) ×3 IMPLANT
SYR CONTROL 10ML LL (SYRINGE) ×6 IMPLANT
TAP CANN VIPER2 DL 5.0 (TAP) ×3 IMPLANT
TAP CANN VIPER2 DL 6.0 (TAP) ×3 IMPLANT
TAP CANN VIPER2 DL 7.0 (TAP) ×3 IMPLANT
TOWEL GREEN STERILE (TOWEL DISPOSABLE) ×3 IMPLANT
TOWEL GREEN STERILE FF (TOWEL DISPOSABLE) ×3 IMPLANT
TRAY FOLEY CATH 14FR (SET/KITS/TRAYS/PACK) ×3 IMPLANT
TRAY FOLEY MTR SLVR 16FR STAT (SET/KITS/TRAYS/PACK) IMPLANT
WATER STERILE IRR 1000ML POUR (IV SOLUTION) ×3 IMPLANT
YANKAUER SUCT BULB TIP NO VENT (SUCTIONS) ×6 IMPLANT

## 2017-11-08 NOTE — Brief Op Note (Addendum)
11/08/2017  2:48 PM  PATIENT:  Rachel French  42 y.o. female  PRE-OPERATIVE DIAGNOSIS:  lumbar spinal stenosis with spondylolisthesis L5-S1, Severe degenerative disc disease L4-5, Mild degenerative disc disease L3-4  POST-OPERATIVE DIAGNOSIS:  umbar spinal stenosis with spondylolisthesis L5-S1, Severe degenerative disc disease L4-5, Mild degenerative disc disease L3-4  PROCEDURE:  Procedure(s): Transforaminal lumbar interbody fusion right L3-4, L4-5, L5-S1, Bilateral L5-S1 Decompression, pedicle screws and rods L3 to S1, cages L3-4, L4-5 and L5-S1, local bone graft, allograft bone graft and Vivigen (N/A) Resection of the neural arch of L5 and the loose pars areas bilateral L5. Delane Ginger(Gill Procedure)  SURGEON:  Surgeon(s) and Role:     Kerrin ChampagneNitka, Shadow Stiggers E, MD - Primary  PHYSICIAN ASSISTANT: Zonia KiefJames Owens, PA-C  ANESTHESIA:   local and general  EBL:  700 mL   BLOOD ADMINISTERED:240 CC CELLSAVER  DRAINS: Urinary Catheter (Foley)   LOCAL MEDICATIONS USED:  MARCAINE 0.5% 1:1 EXPAREL 1.3%  Amount: 30 ml  SPECIMEN:  No Specimen  DISPOSITION OF SPECIMEN:  N/A  COUNTS:  YES  TOURNIQUET:  * No tourniquets in log *  DICTATION: .Dragon Dictation  PLAN OF CARE: Admit to inpatient   PATIENT DISPOSITION:  PACU - hemodynamically stable.   Delay start of Pharmacological VTE agent (>24hrs) due to surgical blood loss or risk of bleeding: yes

## 2017-11-08 NOTE — Progress Notes (Addendum)
1745 Received pt from PACU, A&O x4. Strong and equal push/pulls bil feet. Denies numbness, no tingling. Lumbar dressing dry and intact.

## 2017-11-08 NOTE — Progress Notes (Signed)
Pharmacy Antibiotic Note  Rachel French is a 42 y.o. female admitted on 11/08/2017 for lumbar surgery.  Pharmacy has been consulted for Vancomycin dosing for post-op prophylaxis.  Pt received a pre-op dose of Vancomycin at 0620 this morning.  No drain placed, therefor will only need one post-op dose.  Plan: Vancomycin 1250 mg IV x 1 dose at 1830 tonight.  Height: 5\' 1"  (154.9 cm) Weight: 205 lb (93 kg) IBW/kg (Calculated) : 47.8  Temp (24hrs), Avg:98 F (36.7 C), Min:97.9 F (36.6 C), Max:98 F (36.7 C)  Recent Labs  Lab 11/02/17 1333  WBC 13.1*  CREATININE 0.64    Estimated Creatinine Clearance: 96.3 mL/min (by C-G formula based on SCr of 0.64 mg/dL).    Allergies  Allergen Reactions  . Codeine Swelling    Tongue and mouth swelling  . Levaquin [Levofloxacin In D5w] Itching     Thank you for allowing pharmacy to be a part of this patient's care.  Toys 'R' UsKimberly Amora Sheehy, Pharm.D., BCPS Clinical Pharmacist Pager: [phone] Clinical phone for 11/08/2017 is [phone]. 11/08/2017 6:01 PM

## 2017-11-08 NOTE — Transfer of Care (Signed)
Immediate Anesthesia Transfer of Care Note  Patient: Rachel French  Procedure(s) Performed: Transforaminal lumbar interbody fusion right L3-4, L4-5, L5-S1, Bilateral L5-S1 Decompression, pedicle screws and rods L3 to S1, cages L3-4, L4-5 and L5-S1, local bone graft, allograft bone graft and Vivigen (N/A Back)  Patient Location: PACU  Anesthesia Type:General  Level of Consciousness: drowsy and patient cooperative  Airway & Oxygen Therapy: Patient Spontanous Breathing and Patient connected to face mask oxygen  Post-op Assessment: Report given to RN and Post -op Vital signs reviewed and stable  Post vital signs: Reviewed and stable  Last Vitals:  Vitals Value Taken Time  BP 101/68 11/08/2017  3:26 PM  Temp    Pulse 86 11/08/2017  3:29 PM  Resp 19 11/08/2017  3:29 PM  SpO2 99 % 11/08/2017  3:29 PM  Vitals shown include unvalidated device data.  Last Pain:  Vitals:   11/08/17 0644  TempSrc:   PainSc: 7       Patients Stated Pain Goal: 3 (11/08/17 [id number])  Complications: No apparent anesthesia complications

## 2017-11-08 NOTE — Discharge Instructions (Signed)
° ° °  Call if there is increasing drainage, fever greater than 101.5, severe head aches, and °worsening nausea or light sensitivity. °If shortness of breath, bloody cough or chest tightness or pain go to an emergency room. °No lifting greater than 10 lbs. °Avoid bending, stooping and twisting. °Use brace when sitting and out of bed even to go to bathroom. °Walk in house for first 2 weeks then may start to get out slowly increasing distances up to one half mile by 4-6 weeks post op. °After 5 days may shower and change dressing following bathing with shower.When °bathing remove the brace shower and replace brace before getting out of the shower. °If drainage, keep dry dressing and do not bathe the incision, use an moisture impervious dressing. °Please call and return for scheduled follow up appointment 2 weeks from the time of surgery. ° ° °

## 2017-11-08 NOTE — Anesthesia Procedure Notes (Signed)
Procedure Name: Intubation Date/Time: 11/08/2017 8:00 AM Performed by: Leonor Liv, CRNA Pre-anesthesia Checklist: Patient identified, Emergency Drugs available, Suction available and Patient being monitored Patient Re-evaluated:Patient Re-evaluated prior to induction Oxygen Delivery Method: Circle System Utilized Preoxygenation: Pre-oxygenation with 100% oxygen Induction Type: IV induction Ventilation: Mask ventilation without difficulty Laryngoscope Size: Mac and 3 Grade View: Grade I Tube type: Oral Number of attempts: 1 Airway Equipment and Method: Stylet and Oral airway Placement Confirmation: ETT inserted through vocal cords under direct vision,  positive ETCO2 and breath sounds checked- equal and bilateral Secured at: 21 cm Tube secured with: Tape Dental Injury: Teeth and Oropharynx as per pre-operative assessment

## 2017-11-08 NOTE — Op Note (Addendum)
11/08/2017  3:08 PM  PATIENT:  Rachel French  42 y.o. 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, Severe degenerative disc disease L4-5, Mild degenerative disc disease L3-4  PROCEDURE:  Procedure(s): Transforaminal lumbar interbody fusion right L3-4, L4-5, L5-S1, Bilateral L5-S1 Decompression with neural arch resection removal of loose spondylolysis posterior elements L5(Gill Procedure L5-S1) pedicle screws and rods L3 to S1, cages L3-4, L4-5 and L5-S1, local bone graft, allograft bone graft and Vivigen    SURGEON:  Jessy Oto, MD     ASSISTANT:  Benjiman Core, PA-C  (Present throughout the entire procedure and necessary for completion of procedure in a timely manner)     ANESTHESIA:  General,supplemented with local marcaine 0.5% 1:1 exparel 1.3% total 30 cc, Dr. Ola Spurr.   EBL:700CC  CELL SAVER RETURNED: 128NO    COMPLICATIONS:  None.   DRAINS: Foley to SD.  FINDINGS: Grade 1 isthmic spondylolisthesis L5-S1 due to bilateral spondylolysis, severe degenerative disc disease L4-5 with bilateral lateral recess stenosis, moderate degenerative disc disease with small central disc protrusion L3-4.      COMPONENTS  Implant Name Type Inv. Item Serial No. Manufacturer Lot No. LRB No. Used  BONE VIVIGEN FORMABLE 10CC - [id number] Bone Implant BONE VIVIGEN FORMABLE 10CC [id number] [facility]  N/A 1  SCREW CORT FIX FEN 5.5X7X40MM - [id number] Screw SCREW CORT FIX FEN 5.5X7X40MM  [employer]  N/A 5  SCREW VIPER 7X45MM - [id number] Screw SCREW VIPER 7X45MM  [employer]  N/A 2  SCREW CORT FIX FEN 5.5X7X35MM - [id number] Screw SCREW CORT FIX FEN 5.5X7X35MM  [employer]  N/A 1   BONE CANC CHIPS 20CC - [id number] Bone Implant BONE CANC CHIPS 20CC [id number] [facility]  N/A 1  Concorde ProTi 5 DG 9x10x47m    DEPUY SYNTHES [id number]/A 1  SPACER CONCORDE PRO 5D 9X9X23 - [id number] SPACER CONCORDE PRO 5D 9X9X23  [employer] [id number] N/A 1  SPACER CONCORDE PRO 5D 9X9X23 - [id number] SPACER CONCORDE PRO 5D 9X9X23  [employer] [id number] N/A 1  SCREW SET SINGLE INNER - [id number] SCREW SET SINGLE INNER  [employer]  N/A 8  ROD EXPEDIUM PREBENT 85MM - [id number] ROD EXPEDIUM PREBENT 85MM  [employer]  N/A 2  :   PROCEDURE: The patient was met in the holding area, and the appropriate lumbar levels right L3-4, L4-5 and L5-S1 identified and marked with an "X" and my initials. I had discussion with the patient in the preop holding area regarding consent form.The fusion levels are identified as L3-4, L4-5 and L5-S1. Patient understands the rationale to perform TLIFs at three levels to decompress the bilateral L4-5 and L5-S1 lateral recess and foramenal stenosis, TLIF for treatment of degenerative disc disease L3-4, L4-5 and spondylolisthesis at L5-S1 and to allow for some improved correction of her overall kyphosis. The patient was then transported to OR and was placed under general anestheticwithout difficulty. The patient received appropriate preoperative antibiotic prophylaxis 3 gm Ancef and 1 gram of vancomycin for nasal swab positive MRSA and MSSA.  Nursing staff inserted a Foley catheter under sterile conditions. The patient was then turned to a prone position using the JLemayspine frame. PAS. all pressure  points well padded the arms at the side to 90 90. Standard prep with DuraPrep solution draped in the usual manner from the lower dorsal spine the mid sacral segment. Iodine Vi-Drape was used and the old incision scar was marked. Time-out procedure was called and correct. Skin in the  midline between L2 and S2 was then infiltrated with local anesthesia, marcaine 1/2% 1:1 exparel 1.3% total 20 cc used. Incision was then made  extending from L2-S2  through the skin and subcutaneous layers down to the patient's lumbodorsal fascia and spinous processes. The incision then carried sharply excising the supraspinous ligament and then continuing the lateral aspect of the spinous processes of L2, L3, L4, L5 and S1. Cobb elevator used to carefully elevate the paralumbar muscles off of the posterior elements using electrocautery carefully drilled bleeding and perform dissection of the muscle tissues of the preserving the facet capsule at the L2-3. Continuing the exposure out laterally to expose the lateral margin of the facet joint line at L2-3,L3-4, L4-5 and L5-S1. Incision was carried in the midline down to the S1 level area bleeders controlled using electrocautery monopolar electrocautery.   C-arm fluoroscopy was then brought into the field and using C-arm fluoroscopy then a hole made into the medial aspect of the pedicle of L3 observed in the pedicle using C arm at the 5 oclock position on the left L3 pedicle nerve probe initial entry was determined on fluoroscopy to be good position alignment so that a 4.56m tap was passed to 40 mm within the left L3 pedicle to a depth of nearly 40 mm observed on C-arm fluoroscopy to be beyond the midpoint of the lumbar vertebra and then position alignment within the left L3 pedicle this was then removed and the pedicle channel probed demonstrating patency no sign of rupture the cortex of the pedicle. Tapping with a 4 mm screw tap then 5 mm tap, then 6 mm and then 7.o mm tap,  then 7.0 mm x 40 mm screw was placed on the left side at the L3 level. C-arm fluoroscopy was then brought into the field and using C-arm fluoroscopy then a hole made into the posterior medial aspect of the pedicle of right L3 observed in the pedicle using ball tipped nerve hook and hockey stick  nerve probe initial entry was determined on fluoroscopy to be good position alignment so that 4.055mtap was then used to tap the right L3 pedicle to a depth of nearly 40 mm observed on C-arm fluoroscopy to be beyond the midpoint of the lumbar vertebra and then position alignment within the right L3 pedicle this was then removed and the pedicle channel probed demonstrating patency no sign of rupture the cortex of the pedicle. Tapping with a 5 mm screw tap, then a 6.36m85map then a 7.omm tap,  then 7.0 mm x 40 mm screw was chosen to be placed later following decompression and TLIF.  using C-arm fluoroscopy then a hole made into the medial aspect of the left pedicle of L4 observed in the pedicle using C arm at the 5 oclock position on the left L4 pedicle nerve probe initial entry was determined on fluoroscopy to be good position alignment so that a 4.36mm35mp was passed to 40 mm within the left L4 pedicle to a depth of nearly 40 mm observed on C-arm fluoroscopy to be beyond the midpoint of the lumbar vertebra and then position alignment within the left L4 pedicle this was then removed and the pedicle channel  probed demonstrating patency no sign of rupture the cortex of the pedicle. Tapping with a 4 mm screw tap then 5 mm tap, then a 6.0 mm tap and then a 7.0 mm tap then 7.0 mm x 40 mm screw was placed on the left side at the L4 level. C-arm fluoroscopy was then brought into the field and using C-arm fluoroscopy then a hole made into the posterior medial aspect of the pedicle of right L4 observed in the pedicle using ball tipped nerve hook and hockey stick nerve probe initial entry was determined on fluoroscopy to be good position alignment so that 4.96m tap was then used to tap the right L4 pedicle to a depth of nearly 40 mm observed on C-arm fluoroscopy to be beyond the midpoint of the lumbar vertebra and then position alignment within the right L4 pedicle this was then removed and the pedicle channel probed  demonstrating patency no sign of rupture the cortex of the pedicle. Tapping with a 5 mm screw tap, then a 6.0 mm tap and then a 7.0 mm tap then 7.0 mm x 40 mm screw was chosen to be placed later following decompression and TLIF. C-arm fluoroscopy was then brought into the field and using C-arm fluoroscopy then a hole made into the posterior and medial aspect of the left pedicle of L5 observed in the pedicle using ball tipped nerve hook and hockey stick nerve probe initial entry was determined on fluoroscopy to be good position alignment so that a 4.044mtap was then used to tap the left L5 pedicle to a depth of nearly 40 mm observed on C-arm fluoroscopy to be beyond the posterior one third of the lumbar vertebra and good position alignment within the left L5 pedicle this was then removed and the pedicle channel probed demonstrating patency no sign of rupture the cortex of the pedicle. Tapping with a 5 mm screw tap, the a 6.0 mm tap then a 7.0 mm tap,  then 7.37m2m 40 mm screw was placed on the left side at the L5 level. The pedicle channel of L5 on the left probed demonstrating patency no sign of rupture the cortex of the pedicle. Viper screw for fixation of this level was measured as 7.0 mm x 40 mm screw was placed on the left side at the L5 level. C-arm fluoroscopy was then brought into the field and using C-arm fluoroscopy then a hole made into the posterior and medial aspect of the right pedicle of L5 observed in the pedicle using ball tipped nerve hook and hockey stick nerve probe initial entry was determined on fluoroscopy to be good position alignment so that a 4.0 mm tap was then used to tap the right L5 pedicle to a depth of nearly 40 mm observed on C-arm fluoroscopy to be beyond the posterior one third of the lumbar vertebra and good position alignment within the right L5 pedicle this was then removed and the pedicle channel probed demonstrating patency no sign of rupture the cortex of the pedicle. Tapping  with a 5 mm screw tap, then a 6.0 mm tap and then a 7.0 mm tap,then using C-arm fluoroscopy then a hole made into the medial aspect of the pedicle of right L5 observed in the pedicle using C arm at the 5 oclock position on the right L5 pedicle nerve probe initial entry was determined on fluoroscopy to be good position alignment so that a 4.37mm62mp was passed to 35 mm within the right L5 pedicle to  a depth of nearly 40 mm observed on C-arm fluoroscopy to be beyond the midpoint of the lumbar vertebra and then position alignment within the right L4 pedicle this was then removed and the pedicle channel probed demonstrating patency no sign of rupture the cortex of the pedicle. Tapping with a 4 mm screw tap then 5 mm tap then a 6.0 mmm tap and then a 7.0 mm tap, using C-arm fluoroscopy then a hole made into the medial aspect of the pedicle of L4 observed in the pedicle using C arm at the 5 oclock position on the left L4 pedicle nerve probe initial entry was determined on fluoroscopy to be good position alignment so that a 4.67m tap was passed to 30 mm within the left L4 pedicle to a depth of nearly 40 mm observed on C-arm fluoroscopy to be beyond the midpoint of the lumbar vertebra and then position alignment within the left L4 pedicle this was then removed and the pedicle channel probed demonstrating patency no sign of rupture the cortex of the pedicle. Tapping with a 4 mm screw tap then 5 mm tap then 5.0 mm x 40 mm screw was placed on the left side at the L4 level. C-arm fluoroscopy was then brought into the field and using C-arm fluoroscopy then a hole made into the posterior medial aspect of the pedicle of right L4 observed in the pedicle using ball tipped nerve hook and hockey stick nerve probe initial entry was determined on fluoroscopy to be good position alignment so that 4.068mtap was then used to tap the right L4 pedicle to a depth of nearly 40 mm observed on C-arm fluoroscopy to be beyond the midpoint of the  lumbar vertebra and then position alignment within the right L4 pedicle this was then removed and the pedicle channel probed demonstrating patency no sign of rupture the cortex of the pedicle. Tapping with a 5 mm screw tap then 5.0 mm x 40 mm screw was chosen to be placed later following decompression and TLIF. . Marland Kitchen-arm fluoroscopy was then brought into the field and using C-arm fluoroscopy then a hole made into the posterior medial aspect of the pedicle of right L4 observed in the pedicle using ball tipped nerve hook and hockey stick nerve probe initial entry was determined on fluoroscopy to be good position alignment so that 4.33m1map was then used to tap the right L4 pedicle to a depth of nearly 40 mm observed on C-arm fluoroscopy to be beyond the midpoint of the lumbar vertebra and then position alignment within the right L4 pedicle this was then removed and the pedicle channel probed demonstrating patency no sign of rupture the cortex of the pedicle. Tapping with a 5 mm screw tap then 7.0 mm x 40 mm screw was chosen to be placed later following decompression and TLIF. . TMarland Kitchene pedicle channel of L5 on the right probed demonstrating patency no sign of rupture the cortex of the pedicle. Viper screw for fixation of this level was measured as 7.0 mm x 40 mm screw left L5 for insertion after decompression and TLIF. C-arm fluoroscopy was then brought into the field and using C-arm fluoroscopy then the hole into the pedicle of left S1 was placed and observed with ball-tipped probe the S1 pedicle on this side was 6.0 mm x 35 mm. A 6.33mm17m35mm36mfully passed down the center of the S1 pedicle to a depth of nearly 35 mm. Observed on C-arm fluoroscopy to be in good position alignment  channel was probed with a ball-tipped probe ensure patency no sign of cortical disruption. Following tapping with a 5 mm tap and a 6.0 x 40 mm screw was placed on the left side pedicle at S1. C-arm fluoroscopy was used to localize the hole made  in the medial aspect of the pedicle of S1 on the right localizing the pedicle within the spinal canal with nerve hook and hockey-stick nerve probe carefully passed down the center of the S1 pedicle to a depth of nearly 35 mm. Observed on C-arm fluoroscopy to be in good position alignment channel was probed with a ball-tipped probe ensure predicle screw  to be placed on the right side at the S1 level following decompression and TLIFs..C-arm fluoroscopy was used to localize the hole made in the lateral aspect of the pedicle of S1 on the right localizing the pedicle within the spinal canal with nerve hook and hockey-stick nerve probe re patency no sign of cortical disruption. Following tapping with a 4 mm, 66m and 6 mm taps and thn a  a 7.0 x 35 mm screw was placed on the table to be inserted following decompression and TLIF.  Spinous processes of inferior 50% of  L4 and the entire lamina and pars areas bilateral L5 (Gordy Levanprocedure)were then resected down to the base the lamina at each segment the lower 50% of the spinous process of L4 was resected and Leksell rongeur used to resect inferior aspect of the lamina on the left side at the L4 level and partially on the right side at L4 The left medial 40% of the facet of L4-5 and most of the left entire L5-S1 facet were resected in order to decompress the left side of the lumbar thecal sac at  L4-5 and L5-S1 and decompress the left L4 and L5 and S1 neuroforamen. Osteotomes and 235mand 10m85merrisons were used for this portion of the decompression. At L5-S1 a neural arch resection was carried out at the spondylolisthetic L5-S1 segment was due to bilateral pars interarticularis defects ie. Spondylolysis. The arch and lamina and bilateral pars pseudarthrosis material was resected with 3 mm kerrisons and curretts.  Right side decompression was carried out with complete facetectomies were perform on the right at L3-4, L4-5 and L5-S1 to provide for exposure of the right side  L3-4, L4-5 and L5-S1 neuroforamen for ease of placement of TLIFs (transforaminal lumbar interbody fusion) at each level inferior portions of the lamina and pars were also resected first beginning with the Leksell rongeur and osteotomes and then resecting using 2 and 3 mm Kerrison. Continued laminectomy was carried out resecting the central portions of the inferior 50% lamina of L4 and all of L5 performing foraminotomies on the right side at the L4, L5 and S1levels. The inferior articular process L4 and L5 were resected on the right side. The L5 nerve root identified bilaterally and the medial aspect of the L5 pedicle. 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 Loupe magnification and headlight were used during this portion procedure. The OR microscope was brought in sterilely draped for the final portions of the decompression at L4-5 and L5-S1 and for use while performing the TLIF portion  of the case.  Attention then turned to placement of the transforaminal lumbar interbody fusion cages. Using a Penfield 4 the right lateral aspect of the thecal sac at the L4-5 disc space was carefully freed up The thecal sac could then easily be retracted in the posterior lateral aspect of the L4-5 disc was exposed 15 blade scalpel used to incise the posterolateral disc and an osteotome used to resect a small portion of bone off the superior aspect of the posterior superior vertebral body of L5 in order to ease the entry into the L4-5 disc space. A  153m kerrison rongeur was then able to be introduced in the disc space debrided it was quite narrow. 7 mm dilator shaver was used to dialate the L4-5 disc  space on the right side attempts were made to dilate further the 9 mm were successful and using small curettes and the disc space was debrided a minimal degenerative disc present in the endplates debrided to bleeding endplate bone. Shavers were inserted to trial the intervertebral disc space. A 9 mm x 254mConcord ProTi 360r  cage was carefully packed with morcellized bone graft and the been harvested from previous laminotomies.The cage was then inserted with the articulating insertion handle.  Additional bone graft was then packed into the intervertebral disc space. Bleeding controlled using bipolar electrocautery thrombin soaked gel cottonoids. Then turned to the right L5-S1 level similarly the exposure the posterior lateral aspect this was carried out using a Penfield 4 bipolar electrocautery to control small bleeders present. Derricho retractor used to retract the thecal sac and L5 nerve root a 15 blade scalpel was used to incise posterior lateral aspect of the right L5-S1disc the disc space at this level showed a rather severe narrowing posteriorly was more open anteriorly so that an osteotome again was used to resect a small portion the posterior superior lip of the vertebral body at S1  in order to gain ease of access into the L5-S1 disc space. The space was debrided of degenerative disc material using pituitary along root the entire disc space was then debrided of degenerative disc material using pituitary rongeurs curettage down to bleeding bone endplates. 53m66m8 mm and 9mm41mavers were used to debride the disc space and pituitary ronguers used to remove the loosened debris. This space was then carefully assess using spacers  a 10.0mm 50mal cage provided the best fit, the Concorde ProTi 360r cage 10mm 31mmm w453mhosen so that the permanent 10.0 mm cage by 23mm Co28mde ProTi cage packed with local bone graft was placed into the intervertebral disc space. The posterior intervertebral disc space was then  packed with autogenous local bone graft that been harvested from the central laminectomy. Bleeding controlled using bipolar electrocautery. Using a Penfield 4 the right lateral aspect of the thecal sac at the L3-4 disc space was carefully freed up The thecal sac could then easily be retracted in the posterior lateral aspect of the L3-4 disc was exposed 15 blade scalpel used to incise the posterolateral disc and an osteotome used to resect a small portion of bone off the superior aspect of the posterior superior vertebral body of L4 in order to ease the entry into the L3-4 disc space. A  2mm kerr65mn rongeur was then able to be introduced in the disc space debrided it was quite narrow. 8 mm dilator shaver then 9 mm shaver was used to dialate the L3-4 disc space on the right side attempts were made to dilate further  the 9 mm were successful and using small curettes and the disc space was debrided a minimal degenerative disc present in the endplates debrided to bleeding endplate bone. Shavers were inserted to trial the intervertebral disc space. A 9 mm x 39m Concord ProTi 360r  cage was carefully packed with morcellized bone graft and the been harvested from previous laminotomies.The cage was then inserted with the articulating insertion handle.  Additional bone graft was then packed into the intervertebral disc space. Bleeding controlled using bipolar electrocautery thrombin soaked gel cottonoids. Observed on C-arm fluoroscopy to be in good position alignment. The cages at L3-4, L4-5 and L5-S1 were placed anteriorly as best as possible to maintain the patient's lordosis that was present. With this then the transforaminal lumbar interbody fusion portion of the case was completed bleeders were controlled using bipolar electrocautery thrombin-soaked Gelfoam were appropriate. Decortication of the facet joints carried out bilateral L3-4,L4-5 and L5-S1. These were packed with cancellous local bone graft. 4 pedicle screws  on the left already in place, the 4 viper corticofixation screws on the right were each placed and then each fastener carefully aligned  to allow for placement of rods. The right side first quarter inch titanium rod was then carefully contoured using the french benders and a precontoured 851mrod. This was then placed into the pedicle screws on the right extending from L3-S1 each of the caps were carefully placed loosely tightened. Attention turned to the left side were similarly and then screws were carefully adjusted to allow for a better pattern screws to allow for placement of fixation of the rod a quarter inch 85 mm precontoured titanium rod was then carefully contoured. This was able to be inserted into the left pedicle screw fasteners, Caps onto the L3 fasteners were tightened to 80 foot lbs. Across the right side L3-4, L4-5 and L5-S1 screw fasteners compression was obtained on the right side between L3 and L4,  L4 and L5, then L5 andS1 by compressing between the fasteners and tightening the screw caps 85 pounds. Similarly this was done on the left side at L3-4, L4-5 and L5-S1 obtaining compression and tightened 85 pounds. Irrigation was carried out with copious amounts of saline solution this was done throughout the case. Cell Saver was used during the case. Hockey stick neuroprobe was used to probe the neuroforamen bilateral L3,L4, L5 and S1, these were determined to be well decompressed. Permanent C-arm images were obtained in AP and lateral plane and oblique planes. Remaining local bone graft was then applied along both lateral posterior lateral region extending from L3 toS1 facet beds. Additional bone graft then place on the left side L3-4Gelfoam was then removed spinal canal the lumbodorsal musculature carefully exam debrided of any devitalized tissue following removal of Viper retractors were the bleeders were controlled using electrocautery and the area dorsal lumbar muscle were then approximated in  the midline at L4-S1 with interrupted #1 Vicryl sutures loose the dorsal fascia was reattached to the spinous processes of L2, L3 and L4  superiorly and S1 inferiorly this was done with #1 Vicryl sutures. Subcutaneous layers then approximated using interrupted 0 Vicryl sutures and 2-0 Vicryl sutures. Skin was closed with a running subcutaneous stitch of 4-0 Vicryl Dermabond was applied then MedPlex bandage. All instrument and sponge counts were correct. The patient was then returned to a supine position on her bed reactivated extubated and returned to the recovery room in satisfactory condition.   JaBenjiman CorePA-C perform the duties of asPensions consultanturing  this case. He was present from the beginning of the case to the end of the case assisting in transfer the patient from his stretcher to the OR table and back to the stretcher at the end of the case. Assisted in careful retraction and suction of the laminectomy site delicate neural structures operating under the operating room microscope. He performed closure of the incision from the fascia to the skin applying the dressing.         Basil Dess  11/08/2017, 3:08 PM

## 2017-11-08 NOTE — Interval H&P Note (Signed)
History and Physical Interval Note:  11/08/2017 7:51 AM  Rachel French  has presented today for surgery, with the diagnosis of lumbar spinal stenosis with spondylolisthesis L5-S1, Severe degenerative disc disease L4-5, Mild degenerative disc disease L3-4  The various methods of treatment have been discussed with the patient and family. After consideration of risks, benefits and other options for treatment, the patient has consented to  Procedure(s): Transforaminal lumbar interbody fusion right L3-4, L4-5, L5-S1, Bilateral L5-S1 Decompression, pedicle screws and rods L3 to S1, cages L3-4, L4-5 and L5-S1, local bone graft, allograft bone graft and Vivigen (N/A) as a surgical intervention .  The patient's history has been reviewed, patient examined, no change in status, stable for surgery.  I have reviewed the patient's chart and labs.  Questions were answered to the patient's satisfaction.     Vira BrownsJames Evolett Somarriba

## 2017-11-09 DIAGNOSIS — D62 Acute posthemorrhagic anemia: Secondary | ICD-10-CM | POA: Diagnosis not present

## 2017-11-09 LAB — BASIC METABOLIC PANEL
ANION GAP: 7 (ref 5–15)
BUN: 11 mg/dL (ref 6–20)
CO2: 24 mmol/L (ref 22–32)
Calcium: 7.8 mg/dL — ABNORMAL LOW (ref 8.9–10.3)
Chloride: 107 mmol/L (ref 101–111)
Creatinine, Ser: 0.7 mg/dL (ref 0.44–1.00)
GFR calc Af Amer: >60 mL/min (ref >60)
Glucose, Bld: 130 mg/dL — ABNORMAL HIGH (ref 65–99)
POTASSIUM: 4.2 mmol/L (ref 3.5–5.1)
SODIUM: 138 mmol/L (ref 135–145)

## 2017-11-09 LAB — CBC
HCT: 26.7 % — ABNORMAL LOW (ref 36.0–46.0)
HEMOGLOBIN: 8.6 g/dL — AB (ref 12.0–15.0)
MCH: 31.3 pg (ref 26.0–34.0)
MCHC: 32.2 g/dL (ref 30.0–36.0)
MCV: 97.1 fL (ref 78.0–100.0)
PLATELETS: 268 10*3/uL (ref 150–400)
RBC: 2.75 MIL/uL — AB (ref 3.87–5.11)
RDW: 15.2 % (ref 11.5–15.5)
WBC: 20.1 10*3/uL — AB (ref 4.0–10.5)

## 2017-11-09 LAB — GLUCOSE, CAPILLARY
GLUCOSE-CAPILLARY: 111 mg/dL — AB (ref 65–99)
GLUCOSE-CAPILLARY: 116 mg/dL — AB (ref 65–99)
GLUCOSE-CAPILLARY: 123 mg/dL — AB (ref 65–99)
Glucose-Capillary: 108 mg/dL — ABNORMAL HIGH (ref 65–99)

## 2017-11-09 LAB — HEMOGLOBIN AND HEMATOCRIT, BLOOD
HEMATOCRIT: 27.1 % — AB (ref 36.0–46.0)
Hemoglobin: 8.6 g/dL — ABNORMAL LOW (ref 12.0–15.0)

## 2017-11-09 MED ORDER — BACITRACIN ZINC 500 UNIT/GM EX OINT
TOPICAL_OINTMENT | Freq: Three times a day (TID) | CUTANEOUS | Status: DC
  Administered 2017-11-09 (×2): via TOPICAL
  Filled 2017-11-09: qty 28.4

## 2017-11-09 MED ORDER — WHITE PETROLATUM EX OINT
TOPICAL_OINTMENT | CUTANEOUS | Status: AC
  Filled 2017-11-09: qty 28.35

## 2017-11-09 MED FILL — Sodium Chloride IV Soln 0.9%: INTRAVENOUS | Qty: 1000 | Status: AC

## 2017-11-09 MED FILL — Heparin Sodium (Porcine) Inj 1000 Unit/ML: INTRAMUSCULAR | Qty: 30 | Status: AC

## 2017-11-09 MED FILL — Thrombin For Soln Kit 20000 Unit: CUTANEOUS | Qty: 1 | Status: AC

## 2017-11-09 NOTE — Evaluation (Signed)
Occupational Therapy Evaluation Patient Details Name: Rachel French MRN: [id number] DOB: [birth year] Today's Date: 11/09/2017    History of Present Illness Pt is a 42 y/o female s/p TLIF L3-S1. PMH including but not limited to PCOS, spinal stenosis and posterior cervical fusion C6-C7 in 2016.   Clinical Impression   PTA, pt was independent with ADL and functional mobility. She is currently limited by back pain at incision and decreased activity tolerance for ADL participation. Pt educated concerning back precautions related to ADL, brace wear schedule, methods to don/doff brace, and compensatory strategies for LB dressing tasks. Pt with increase in pain with figure 4 method and may benefit from AE education to maximize safety and protection of spine. Pt educated concerning safe tub transfers with RW and recommendation to utilize 3-in-1 as shower seat to maximize safety. She currently requires min guard assist for LB ADL, min guard assist for toilet transfers, and supervision for standing grooming tasks. Pt would benefit from continued OT services while admitted to improve independence and safety with ADL and functional mobility prior to returning home with assistance from family. Will continue to follow while admitted.     Follow Up Recommendations  No OT follow up;Supervision/Assistance - 24 hour    Equipment Recommendations  3 in 1 bedside commode;Other (comment)(AE for LB ADL)    Recommendations for Other Services       Precautions / Restrictions Precautions Precautions: Fall;Back Precaution Booklet Issued: No Precaution Comments: Reviewed back precautions and provided cues as needed.  Required Braces or Orthoses: Spinal Brace Spinal Brace: Lumbar corset;Applied in sitting position(off for showering) Restrictions Weight Bearing Restrictions: No      Mobility Bed Mobility Overal bed mobility: Needs Assistance Bed Mobility: Rolling;Sidelying to Sit;Sit to Sidelying Rolling:  Supervision Sidelying to sit: Supervision     Sit to sidelying: Supervision General bed mobility comments: Supervision for safety. Able to bring B LE back to bed on her own. Cues to always use log roll technique as pt attempting to prop on elbows at times to access water cup.   Transfers Overall transfer level: Needs assistance Equipment used: Rolling walker (2 wheeled) Transfers: Sit to/from Stand Sit to Stand: Min guard         General transfer comment: Min guard assist to power up.     Balance Overall balance assessment: Needs assistance Sitting-balance support: Feet supported Sitting balance-Leahy Scale: Good     Standing balance support: During functional activity;Bilateral upper extremity supported Standing balance-Leahy Scale: Fair Standing balance comment: Able to statically stand at sink for grooming tasks.                            ADL either performed or assessed with clinical judgement   ADL Overall ADL's : Needs assistance/impaired Eating/Feeding: Set up;Sitting   Grooming: Supervision/safety;Standing   Upper Body Bathing: Set up;Sitting   Lower Body Bathing: Sit to/from stand;Min guard   Upper Body Dressing : Set up;Sitting   Lower Body Dressing: Sit to/from stand;Min guard Lower Body Dressing Details (indicate cue type and reason): Figure 4 positioning creates pain. Pt may benefit from AE.  Toilet Transfer: ImmunologistMin guard;RW Toilet Transfer Details (indicate cue type and reason): Min guard assist with RW.  Toileting- ArchitectClothing Manipulation and Hygiene: Min guard;Sit to/from stand   Tub/ Shower Transfer: Minimal assistance;Tub transfer;Ambulation;3 in 1;Rolling walker Tub/Shower Transfer Details (indicate cue type and reason): Simulated in room. Began education concerning use of 3-in-1 as shower seat.  Pt hesitant to utilize as she has been sitting on the edge of her tub. Encouraged pt to consider as sitting on tub edge may create unsafe situation.   Functional mobility during ADLs: Min guard;Rolling walker General ADL Comments: Pt educated concerning back precautions related to ADL as well as compensatory strategies for LB ADL, toileting tasks, and shower transfers. Pt requiring cues throughout to avoid breaking precautions.      Vision Baseline Vision/History: Wears glasses(or contacts) Wears Glasses: At all times Patient Visual Report: No change from baseline Vision Assessment?: No apparent visual deficits     Perception     Praxis      Pertinent Vitals/Pain Pain Assessment: Faces Faces Pain Scale: Hurts little more Pain Location: back, posterior L hip Pain Descriptors / Indicators: Sore Pain Intervention(s): Limited activity within patient's tolerance;Monitored during session     Hand Dominance     Extremity/Trunk Assessment Upper Extremity Assessment Upper Extremity Assessment: Overall WFL for tasks assessed   Lower Extremity Assessment Lower Extremity Assessment: Defer to PT evaluation RLE Deficits / Details: pt with functional weakness of R LE as compared to L LE with mild buckling of R knee during terminal stance phase of gait   Cervical / Trunk Assessment Cervical / Trunk Assessment: Other exceptions Cervical / Trunk Exceptions: s/p lumbar sx, hx of cervical spine sx   Communication Communication Communication: No difficulties   Cognition Arousal/Alertness: Awake/alert Behavior During Therapy: WFL for tasks assessed/performed Overall Cognitive Status: Within Functional Limits for tasks assessed                                     General Comments       Exercises     Shoulder Instructions      Home Living Family/patient expects to be discharged to:: Private residence Living Arrangements: Spouse/significant other;Children Available Help at Discharge: Family;Available 24 hours/day Type of Home: House Home Access: Stairs to enter [employer] of Steps: 8 Entrance  Stairs-Rails: None Home Layout: One level     Bathroom Shower/Tub: Chief Strategy Officer: Standard     Home Equipment: Crutches;Hand held shower head          Prior Functioning/Environment Level of Independence: Independent                 OT Problem List: Decreased strength;Decreased range of motion;Decreased activity tolerance;Impaired balance (sitting and/or standing);Decreased safety awareness;Decreased knowledge of use of DME or AE;Decreased knowledge of precautions;Pain      OT Treatment/Interventions: Self-care/ADL training;Therapeutic exercise;Energy conservation;DME and/or AE instruction;Therapeutic activities;Patient/family education;Balance training    OT Goals(Current goals can be found in the care plan section) Acute Rehab OT Goals Patient Stated Goal: return home OT Goal Formulation: With patient Time For Goal Achievement: 11/23/17 Potential to Achieve Goals: Good ADL Goals Pt Will Perform Lower Body Bathing: with modified independence;sit to/from stand;with adaptive equipment Pt Will Perform Lower Body Dressing: with modified independence;sit to/from stand;with adaptive equipment Pt Will Transfer to Toilet: with modified independence;ambulating;bedside commode(BSC over toilet) Pt Will Perform Toileting - Clothing Manipulation and hygiene: with modified independence;sit to/from stand Pt Will Perform Tub/Shower Transfer: with modified independence;3 in 1;rolling walker;Tub transfer  OT Frequency: Min 2X/week   Barriers to D/C:            Co-evaluation              AM-PAC PT "6 Clicks" Daily Activity  Outcome Measure Help from another person eating meals?: None Help from another person taking care of personal grooming?: A Little Help from another person toileting, which includes using toliet, bedpan, or urinal?: A Little Help from another person bathing (including washing, rinsing, drying)?: A Little Help from another person to  put on and taking off regular upper body clothing?: None Help from another person to put on and taking off regular lower body clothing?: A Little 6 Click Score: 20   End of Session Equipment Utilized During Treatment: Rolling walker;Back brace  Activity Tolerance: Patient tolerated treatment well Patient left: in bed;with call bell/phone within reach  OT Visit Diagnosis: Other abnormalities of gait and mobility (R26.89);Pain Pain - part of body: (back)                Time: 1210-1230 OT Time Calculation (min): 20 min Charges:  OT General Charges $OT Visit: 1 Visit OT Evaluation $OT Eval Moderate Complexity: 1 Mod G-Codes:     Doristine Section, MS OTR/L  Pager: [phone]   Darya Bigler A Zhania Shaheen 11/09/2017, 1:29 PM

## 2017-11-09 NOTE — Progress Notes (Signed)
     Subjective: 1 Day Post-Op Procedure(s) (LRB): Transforaminal lumbar interbody fusion right L3-4, L4-5, L5-S1, Bilateral L5-S1 Decompression, pedicle screws and rods L3 to S1, cages L3-4, L4-5 and L5-S1, local bone graft, allograft bone graft and Vivigen (N/A) Awake, alert and oriented x 4. Foley discontinued late morning, no void yet. Up with PT/OT, brace is in the room and she is using while out of bed. Dressing changed with visit with Zonia KiefJames Owens this AM.   Patient reports pain as moderate.    Objective:   VITALS:  Temp:  [97.7 F (36.5 C)-98.2 F (36.8 C)] 98.2 F (36.8 C) (06/12 0556) Pulse Rate:  [75-98] 81 (06/12 0556) Resp:  [11-17] 15 (06/12 0556) BP: (93-116)/(59-77) 93/59 (06/12 0556) SpO2:  [92 %-100 %] 100 % (06/12 0556)  Neurologically intact ABD soft Neurovascular intact Sensation intact distally Intact pulses distally Dorsiflexion/Plantar flexion intact Incision: dressing C/D/I and no drainage   LABS Recent Labs    11/09/17 0727 11/09/17 1339  HGB 8.6* 8.6*  WBC 20.1*  --   PLT 268  --    Recent Labs    11/09/17 0727  NA 138  K 4.2  CL 107  CO2 24  BUN 11  CREATININE 0.70  GLUCOSE 130*   No results for input(s): LABPT, INR in the last 72 hours.   Assessment/Plan: 1 Day Post-Op Procedure(s) (LRB): Transforaminal lumbar interbody fusion right L3-4, L4-5, L5-S1, Bilateral L5-S1 Decompression, pedicle screws and rods L3 to S1, cages L3-4, L4-5 and L5-S1, local bone graft, allograft bone graft and Vivigen (N/A) Anemia due to blood perioperatively.  Advance diet Up with therapy D/C IV fluids  Check AM H/H as she had a significant decrease in Hgb post op 3 level lumbar fusion.   Vira BrownsJames Nitka 11/09/2017, 3:47 PMPatient ID: Rachel HyKelly French, female   DOB: January 19, 1976, 42 y.o.   MRN: [id number]

## 2017-11-09 NOTE — Anesthesia Postprocedure Evaluation (Signed)
Anesthesia Post Note  Patient: Rachel French  Procedure(s) Performed: Transforaminal lumbar interbody fusion right L3-4, L4-5, L5-S1, Bilateral L5-S1 Decompression, pedicle screws and rods L3 to S1, cages L3-4, L4-5 and L5-S1, local bone graft, allograft bone graft and Vivigen (N/A Back)     Patient location during evaluation: PACU Anesthesia Type: General Level of consciousness: awake, sedated and oriented Pain management: pain level controlled Vital Signs Assessment: post-procedure vital signs reviewed and stable Respiratory status: spontaneous breathing, nonlabored ventilation, respiratory function stable and patient connected to nasal cannula oxygen Cardiovascular status: blood pressure returned to baseline and stable Postop Assessment: no apparent nausea or vomiting Anesthetic complications: no    Last Vitals:  Vitals:   11/08/17 2327 11/09/17 0556  BP: 110/77 (!) 93/59  Pulse: 89 81  Resp: 15 15  Temp: 36.5 C 36.8 C  SpO2: 99% 100%    Last Pain:  Vitals:   11/09/17 0556  TempSrc: Oral  PainSc:                  Deland Slocumb,JAMES TERRILL

## 2017-11-09 NOTE — Plan of Care (Signed)
  Problem: Activity: Goal: Risk for activity intolerance will decrease Outcome: Progressing   

## 2017-11-09 NOTE — Evaluation (Signed)
Physical Therapy Evaluation Patient Details Name: Rachel French MRN: [id number] DOB: [birth year] Today's Date: 11/09/2017   History of Present Illness  Pt is a 42 y/o female s/p TLIF L3-S1. PMH including but not limited to PCOS, spinal stenosis and posterior cervical fusion C6-C7 in 2016.    Clinical Impression  Pt presented supine in bed with HOB elevated, awake and willing to participate in therapy session. Prior to admission, pt reported that she was independent with all functional mobility and ADLs. Pt lives with her husband and 62 year old daughter who will be able to provide 24/7 supervision/assistance upon d/c. Pt currently able to perform bed mobility with supervision to min A, transfers with min guard and ambulation within her room with RW and min guard. Limited this session as pt's back brace had not yet been delivered. Pt moving well during evaluation. She will need stair training prior to d/c as she has eight steps to enter her home (no railing).  Pt would continue to benefit from skilled physical therapy services at this time while admitted and after d/c to address the below listed limitations in order to improve overall safety and independence with functional mobility.     Follow Up Recommendations Supervision - Intermittent;Other (comment)(OP PT when cleared by MD)    Equipment Recommendations  Rolling walker with 5" wheels    Recommendations for Other Services       Precautions / Restrictions Precautions Precautions: Fall;Back Precaution Booklet Issued: No Precaution Comments: PT reviewed 3/3 back precautions and log roll technique with pt throughout Required Braces or Orthoses: Spinal Brace Spinal Brace: Lumbar corset;Applied in sitting position;Other (comment)(brace not present during evaluation) Restrictions Weight Bearing Restrictions: No      Mobility  Bed Mobility Overal bed mobility: Needs Assistance Bed Mobility: Rolling;Sidelying to Sit;Sit to  Sidelying Rolling: Supervision Sidelying to sit: Supervision     Sit to sidelying: Min assist General bed mobility comments: increased time and effort, cueing for log roll technique, min A to return bilateral LEs onto bed  Transfers Overall transfer level: Needs assistance Equipment used: Rolling walker (2 wheeled) Transfers: Sit to/from Stand Sit to Stand: Min guard         General transfer comment: increased time and effort, cueing for safe hand placement, min guard for safety  Ambulation/Gait Ambulation/Gait assistance: Min guard Ambulation Distance (Feet): 50 Feet Assistive device: Rolling walker (2 wheeled) Gait Pattern/deviations: Step-through pattern;Decreased stride length Gait velocity: decreased Gait velocity interpretation: <1.31 ft/sec, indicative of household ambulator General Gait Details: mild instability with minor R knee buckling at terminal stance of gait, steady with RW with no overt LOB or need for physical assistance  Stairs            Wheelchair Mobility    Modified Rankin (Stroke Patients Only)       Balance Overall balance assessment: Needs assistance Sitting-balance support: Feet supported Sitting balance-Leahy Scale: Good     Standing balance support: During functional activity;Bilateral upper extremity supported Standing balance-Leahy Scale: Poor                               Pertinent Vitals/Pain Pain Assessment: Faces Faces Pain Scale: Hurts little more Pain Location: back, posterior L hip Pain Descriptors / Indicators: Sore Pain Intervention(s): Monitored during session;Repositioned    Home Living Family/patient expects to be discharged to:: Private residence Living Arrangements: Spouse/significant other;Children Available Help at Discharge: Family;Available 24 hours/day Type of Home: House  Home Access: Stairs to enter Entrance Stairs-Rails: None Entrance Stairs-Number of Steps: 8 Home Layout: One  level Home Equipment: Crutches;Hand held shower head      Prior Function Level of Independence: Independent               Hand Dominance        Extremity/Trunk Assessment   Upper Extremity Assessment Upper Extremity Assessment: Defer to OT evaluation    Lower Extremity Assessment Lower Extremity Assessment: Generalized weakness;RLE deficits/detail RLE Deficits / Details: pt with functional weakness of R LE as compared to L LE with mild buckling of R knee during terminal stance phase of gait    Cervical / Trunk Assessment Cervical / Trunk Assessment: Other exceptions Cervical / Trunk Exceptions: s/p lumbar sx, hx of cervical spine sx  Communication   Communication: No difficulties  Cognition Arousal/Alertness: Awake/alert Behavior During Therapy: WFL for tasks assessed/performed Overall Cognitive Status: Within Functional Limits for tasks assessed                                        General Comments      Exercises     Assessment/Plan    PT Assessment Patient needs continued PT services  PT Problem List Decreased strength;Decreased activity tolerance;Decreased balance;Decreased mobility;Decreased coordination;Decreased knowledge of use of DME;Decreased safety awareness;Decreased knowledge of precautions;Pain       PT Treatment Interventions DME instruction;Gait training;Functional mobility training;Therapeutic activities;Therapeutic exercise;Neuromuscular re-education;Stair training;Balance training;Patient/family education    PT Goals (Current goals can be found in the Care Plan section)  Acute Rehab PT Goals Patient Stated Goal: return home PT Goal Formulation: With patient Time For Goal Achievement: 11/23/17 Potential to Achieve Goals: Good    Frequency Min 5X/week   Barriers to discharge        Co-evaluation               AM-PAC PT "6 Clicks" Daily Activity  Outcome Measure Difficulty turning over in bed (including  adjusting bedclothes, sheets and blankets)?: A Little Difficulty moving from lying on back to sitting on the side of the bed? : A Little Difficulty sitting down on and standing up from a chair with arms (e.g., wheelchair, bedside commode, etc,.)?: Unable Help needed moving to and from a bed to chair (including a wheelchair)?: A Little Help needed walking in hospital room?: A Little Help needed climbing 3-5 steps with a railing? : A Little 6 Click Score: 16    End of Session Equipment Utilized During Treatment: Gait belt Activity Tolerance: Patient tolerated treatment well Patient left: in bed;with call bell/phone within reach Nurse Communication: Mobility status PT Visit Diagnosis: Other abnormalities of gait and mobility (R26.89);Pain Pain - part of body: (back)    Time: [id number] PT Time Calculation (min) (ACUTE ONLY): 30 min   Charges:   PT Evaluation $PT Eval Moderate Complexity: 1 Mod PT Treatments $Therapeutic Activity: 8-22 mins   PT G Codes:        CanutilloJennifer Parrie Rasco, PT, DPT [phone]   Alessandra BevelsJennifer M Ceclia Koker 11/09/2017, 11:43 AM

## 2017-11-09 NOTE — Progress Notes (Signed)
Orthopedic Tech Progress Note Patient Details:  Rachel HyKelly Curenton 03/30/1976 [id number]  Patient ID: Rachel French, female   DOB: 03/30/1976, 42 y.o.   MRN: [id number]   Rachel French, Rachel French 11/09/2017, 10:00 AM Called in bio-tech brace order; spoke with Wylene MenLacey

## 2017-11-09 NOTE — Progress Notes (Signed)
Subjective: Doing well.  preop leg pain much better.  Some nausea last night but fine now.  Has blister chin since coming out of surgery.  Denies feeling lightheaded, dizzy.    Objective: Vital signs in last 24 hours: Temp:  [97.7 F (36.5 C)-98.2 F (36.8 C)] 98.2 F (36.8 C) (06/12 0556) Pulse Rate:  [75-98] 81 (06/12 0556) Resp:  [11-17] 15 (06/12 0556) BP: (93-116)/(59-77) 93/59 (06/12 0556) SpO2:  [92 %-100 %] 100 % (06/12 0556)  Intake/Output from previous day: 06/11 0701 - 06/12 0700 In: 4460 [P.O.:720; I.V.:3050; Blood:240; IV Piggyback:450] Out: 2385 [Urine:1685; Blood:700] Intake/Output this shift: No intake/output data recorded.  Recent Labs    11/09/17 0727  HGB 8.6*   Recent Labs    11/09/17 0727  WBC 20.1*  RBC 2.75*  HCT 26.7*  PLT 268   Recent Labs    11/09/17 0727  NA 138  K 4.2  CL 107  CO2 24  BUN 11  CREATININE 0.70  GLUCOSE 130*  CALCIUM 7.8*   No results for input(s): LABPT, INR in the last 72 hours.  Exam; Alert and oriented.  NAD.  Wound looks good.  Dressing changed.  bilat calves nontender. NVI.  No focal motor deficits.  Blister on chin.       Assessment/Plan: Hemoglobin 8.6 (preop 12.5).  Currently asymptomatic.  Will monitor and see how she does with therapy.  May need transfusion prbc's.  Will order bacitracin ointment for chin. Advised this is likely from pressure after being supine for several hours during surgery.      Zonia KiefJames Elba Dendinger 11/09/2017, 9:17 AM

## 2017-11-10 LAB — CBC WITH DIFFERENTIAL/PLATELET
Abs Immature Granulocytes: 0.1 K/uL (ref 0.0–0.1)
Basophils Absolute: 0.1 K/uL (ref 0.0–0.1)
Basophils Relative: 0 %
Eosinophils Absolute: 0.4 K/uL (ref 0.0–0.7)
Eosinophils Relative: 2 %
HCT: 21.9 % — ABNORMAL LOW (ref 36.0–46.0)
Hemoglobin: 7.1 g/dL — ABNORMAL LOW (ref 12.0–15.0)
Immature Granulocytes: 1 %
Lymphocytes Relative: 29 %
Lymphs Abs: 5.1 K/uL — ABNORMAL HIGH (ref 0.7–4.0)
MCH: 31.7 pg (ref 26.0–34.0)
MCHC: 32.4 g/dL (ref 30.0–36.0)
MCV: 97.8 fL (ref 78.0–100.0)
Monocytes Absolute: 1.9 K/uL — ABNORMAL HIGH (ref 0.1–1.0)
Monocytes Relative: 11 %
Neutro Abs: 10.3 K/uL — ABNORMAL HIGH (ref 1.7–7.7)
Neutrophils Relative %: 57 %
Platelets: 235 K/uL (ref 150–400)
RBC: 2.24 MIL/uL — ABNORMAL LOW (ref 3.87–5.11)
RDW: 15.3 % (ref 11.5–15.5)
WBC: 17.8 K/uL — ABNORMAL HIGH (ref 4.0–10.5)

## 2017-11-10 LAB — BASIC METABOLIC PANEL WITH GFR
Anion gap: 8 (ref 5–15)
BUN: 8 mg/dL (ref 6–20)
CO2: 23 mmol/L (ref 22–32)
Calcium: 7.5 mg/dL — ABNORMAL LOW (ref 8.9–10.3)
Chloride: 106 mmol/L (ref 101–111)
Creatinine, Ser: 0.62 mg/dL (ref 0.44–1.00)
GFR calc Af Amer: >60 mL/min
GFR calc non Af Amer: >60 mL/min
Glucose, Bld: 137 mg/dL — ABNORMAL HIGH (ref 65–99)
Potassium: 3.8 mmol/L (ref 3.5–5.1)
Sodium: 137 mmol/L (ref 135–145)

## 2017-11-10 LAB — PREPARE RBC (CROSSMATCH)

## 2017-11-10 LAB — CBC
HCT: 23.2 % — ABNORMAL LOW (ref 36.0–46.0)
HEMOGLOBIN: 7.5 g/dL — AB (ref 12.0–15.0)
MCH: 31.3 pg (ref 26.0–34.0)
MCHC: 32.3 g/dL (ref 30.0–36.0)
MCV: 96.7 fL (ref 78.0–100.0)
PLATELETS: 254 10*3/uL (ref 150–400)
RBC: 2.4 MIL/uL — AB (ref 3.87–5.11)
RDW: 15.1 % (ref 11.5–15.5)
WBC: 20 10*3/uL — ABNORMAL HIGH (ref 4.0–10.5)

## 2017-11-10 LAB — GLUCOSE, CAPILLARY
GLUCOSE-CAPILLARY: 133 mg/dL — AB (ref 65–99)
Glucose-Capillary: 104 mg/dL — ABNORMAL HIGH (ref 65–99)
Glucose-Capillary: 134 mg/dL — ABNORMAL HIGH (ref 65–99)

## 2017-11-10 MED ORDER — GABAPENTIN 300 MG PO CAPS
300.0000 mg | ORAL_CAPSULE | Freq: Three times a day (TID) | ORAL | Status: DC
  Administered 2017-11-10 – 2017-11-11 (×3): 300 mg via ORAL
  Filled 2017-11-10 (×4): qty 1

## 2017-11-10 MED ORDER — MUPIROCIN CALCIUM 2 % EX CREA
TOPICAL_CREAM | Freq: Two times a day (BID) | CUTANEOUS | Status: DC
  Administered 2017-11-10 – 2017-11-11 (×3): via TOPICAL
  Filled 2017-11-10: qty 15

## 2017-11-10 MED ORDER — SIMETHICONE 80 MG PO CHEW: 80.0000 mg | CHEWABLE_TABLET | Freq: Four times a day (QID) | ORAL | Status: DC | PRN

## 2017-11-10 MED ORDER — SODIUM CHLORIDE 0.9 % IV SOLN
Freq: Once | INTRAVENOUS | Status: AC
  Administered 2017-11-10: 18:00:00 via INTRAVENOUS

## 2017-11-10 MED ORDER — FERROUS GLUCONATE 324 (38 FE) MG PO TABS
324.0000 mg | ORAL_TABLET | Freq: Three times a day (TID) | ORAL | Status: DC
  Administered 2017-11-10 – 2017-11-11 (×4): 324 mg via ORAL
  Filled 2017-11-10 (×5): qty 1

## 2017-11-10 MED ORDER — LOPERAMIDE HCL 2 MG PO CAPS
4.0000 mg | ORAL_CAPSULE | ORAL | Status: DC | PRN
  Administered 2017-11-11: 4 mg via ORAL
  Filled 2017-11-10: qty 2

## 2017-11-10 NOTE — Progress Notes (Signed)
Physical Therapy Treatment Patient Details Name: Rachel French MRN: [id number] DOB: 12-20-1975 Today's Date: 11/10/2017    History of Present Illness Pt is a 42 y/o female s/p TLIF L3-S1. PMH including but not limited to PCOS, spinal stenosis and posterior cervical fusion C6-C7 in 2016.    PT Comments    Pt limited this session secondary to pain and weakness, limited to bed to chair ambulation. Pt able to perform log roll for bed mobility with supervision, transfers with min guard and short distance ambulation with RW and min guard for safety. Pt would continue to benefit from skilled physical therapy services at this time while admitted and after d/c to address the below listed limitations in order to improve overall safety and independence with functional mobility.    Follow Up Recommendations  Supervision - Intermittent;Other (comment)(OP PT when cleared by MD)     Equipment Recommendations  Rolling walker with 5" wheels    Recommendations for Other Services       Precautions / Restrictions Precautions Precautions: Fall;Back Precaution Booklet Issued: No Precaution Comments: Reviewed back precautions and provided cues as needed.  Required Braces or Orthoses: Spinal Brace Spinal Brace: Lumbar corset;Applied in sitting position Restrictions Weight Bearing Restrictions: No    Mobility  Bed Mobility Overal bed mobility: Needs Assistance Bed Mobility: Rolling;Sidelying to Sit Rolling: Supervision Sidelying to sit: Supervision       General bed mobility comments: supervision for safety, good log roll technique  Transfers Overall transfer level: Needs assistance Equipment used: Rolling walker (2 wheeled) Transfers: Sit to/from Stand Sit to Stand: Min guard         General transfer comment: min guard for safety  Ambulation/Gait Ambulation/Gait assistance: Min guard Gait Distance (Feet): 5 Feet Assistive device: Rolling walker (2 wheeled) Gait Pattern/deviations:  Step-through pattern;Decreased stride length Gait velocity: decreased Gait velocity interpretation: <1.31 ft/sec, indicative of household ambulator General Gait Details: pt limited this session secondary to feeling light-headed with Hgb drop to 7.5 this AM; therefore, limited to bed to chair   Stairs             Wheelchair Mobility    Modified Rankin (Stroke Patients Only)       Balance Overall balance assessment: Needs assistance Sitting-balance support: Feet supported Sitting balance-Leahy Scale: Good     Standing balance support: During functional activity;Bilateral upper extremity supported Standing balance-Leahy Scale: Fair Standing balance comment: Able to statically stand at sink for grooming tasks                            Cognition Arousal/Alertness: Awake/alert Behavior During Therapy: WFL for tasks assessed/performed Overall Cognitive Status: Within Functional Limits for tasks assessed                                        Exercises      General Comments        Pertinent Vitals/Pain Pain Assessment: Faces Pain Score: 6  Faces Pain Scale: Hurts even more Pain Location: L LE Pain Descriptors / Indicators: Sore;Shooting Pain Intervention(s): Monitored during session;Repositioned    Home Living                      Prior Function            PT Goals (current goals can now be found in the  care plan section) Acute Rehab PT Goals Patient Stated Goal: return home PT Goal Formulation: With patient Time For Goal Achievement: 11/23/17 Potential to Achieve Goals: Good Progress towards PT goals: Progressing toward goals    Frequency    Min 5X/week      PT Plan Current plan remains appropriate    Co-evaluation              AM-PAC PT "6 Clicks" Daily Activity  Outcome Measure  Difficulty turning over in bed (including adjusting bedclothes, sheets and blankets)?: None Difficulty moving from  lying on back to sitting on the side of the bed? : A Little Difficulty sitting down on and standing up from a chair with arms (e.g., wheelchair, bedside commode, etc,.)?: Unable Help needed moving to and from a bed to chair (including a wheelchair)?: A Little Help needed walking in hospital room?: A Little Help needed climbing 3-5 steps with a railing? : A Little 6 Click Score: 17    End of Session Equipment Utilized During Treatment: Gait belt Activity Tolerance: Patient tolerated treatment well Patient left: in chair;with call bell/phone within reach Nurse Communication: Mobility status PT Visit Diagnosis: Other abnormalities of gait and mobility (R26.89);Pain Pain - part of body: (back)     Time: 4098-1191 PT Time Calculation (min) (ACUTE ONLY): 18 min  Charges:  $Therapeutic Activity: 8-22 mins                    G Codes:       [place], [place], [place] [phone]    Alessandra Bevels Erion Hermans 11/10/2017, 5:21 PM

## 2017-11-10 NOTE — Progress Notes (Signed)
Occupational Therapy Treatment Patient Details Name: Rachel French MRN: [id number] DOB: [birth year] Today's Date: 11/10/2017    History of present illness Pt is a 42 y/o female s/p TLIF L3-S1. PMH including but not limited to PCOS, spinal stenosis and posterior cervical fusion C6-C7 in 2016.   OT comments  Patient pleasant and cooperative.  Reviewed fall precautions (as patient standing in room, heading to restroom upon therapist entry), and back precautions (required minimal cueing to adhere to during functional tasks).  Patient reports no pain with figure 4 LB dressing tasks today, continue to assess as patient is recovering.  Able to complete toilet transfer with min guard assist, but when exiting simulated tub transfer patient requires max assist due to loss of balance from shooting pain in L LE.  Nursing aware and present in room providing pain medications at therapist exiting room. Patient will benefit from continued OT services while admitted in order to promote increased safety and adherence to precautions, as well as maximize independence prior to dc home.  Will continue to follow while admitted.    Follow Up Recommendations  No OT follow up;Supervision/Assistance - 24 hour    Equipment Recommendations  3 in 1 bedside commode    Recommendations for Other Services      Precautions / Restrictions Precautions Precautions: Fall;Back Precaution Booklet Issued: No Precaution Comments: Reviewed back precautions and provided cues as needed.  Required Braces or Orthoses: Spinal Brace Spinal Brace: Lumbar corset;Applied in sitting position(off for showering) Restrictions Weight Bearing Restrictions: No       Mobility Bed Mobility Overal bed mobility: Needs Assistance             General bed mobility comments: patient standing in room upon therapist entering, educated on importance of having assistance for mobility in room; patient agreeable   Transfers Overall transfer level:  Needs assistance Equipment used: Rolling walker (2 wheeled) Transfers: Sit to/from Stand Sit to Stand: Min guard         General transfer comment: overall min guard assist, maxA during simulated tub transfer due to loss of balance     Balance Overall balance assessment: Needs assistance Sitting-balance support: Feet supported Sitting balance-Leahy Scale: Good     Standing balance support: During functional activity;Bilateral upper extremity supported Standing balance-Leahy Scale: Fair Standing balance comment: Able to statically stand at sink for grooming tasks                           ADL either performed or assessed with clinical judgement   ADL Overall ADL's : Needs assistance/impaired     Grooming: Supervision/safety;Standing Grooming Details (indicate cue type and reason): standing at sink to wash hands          Upper Body Dressing : Set up;Sitting Upper Body Dressing Details (indicate cue type and reason): donning brace with SU assist Lower Body Dressing: Sit to/from stand;Min guard Lower Body Dressing Details (indicate cue type and reason): no pain with figure 4 positioning, able to don and doff B socks with SPV; would required min guard in standing to pull pants over hips  Toilet Transfer: Min IT sales professional Details (indicate cue type and reason): Min guard assist with RW.  Toileting- Architect and Hygiene: Min guard;Sit to/from stand;Cueing for back precautions Toileting - Clothing Manipulation Details (indicate cue type and reason): cueing for techniques to ensure hygiene with back precautions  Tub/ Shower Transfer: Tub transfer;Ambulation;3 in 1;Rolling walker;Maximal assistance Tub/Shower Transfer Details (  indicate cue type and reason): Simulated in  room.  Patient agreeable to using 3:1 as shower seat, did not mention sitting on edge of tub.  Patient simulated task, stepping "into" backwards with min guard assistance  and stepping "out" with L leg first and patient reports "shooting pain", looses balance and requires maxA to regain position.    General ADL Comments: Pt reviewed back precautions related to ADL as well as compensatory strategies for LB ADL, toileting tasks, and shower transfers. Pt requiring cues throughout to avoid breaking precautions.      Vision Baseline Vision/History: Wears glasses(or contacts) Wears Glasses: At all times Patient Visual Report: No change from baseline Vision Assessment?: No apparent visual deficits   Perception     Praxis      Cognition Arousal/Alertness: Awake/alert Behavior During Therapy: WFL for tasks assessed/performed Overall Cognitive Status: Within Functional Limits for tasks assessed                                          Exercises     Shoulder Instructions       General Comments      Pertinent Vitals/ Pain       Pain Assessment: 0-10 Pain Score: 6  Pain Location: L LE Pain Descriptors / Indicators: Sore;Shooting Pain Intervention(s): Limited activity within patient's tolerance;Monitored during session;RN gave pain meds during session;Repositioned(nursing present at end of session providing medications )  Home Living                                          Prior Functioning/Environment              Frequency  Min 2X/week        Progress Toward Goals  OT Goals(current goals can now be found in the care plan section)  Progress towards OT goals: Progressing toward goals  Acute Rehab OT Goals Patient Stated Goal: return home OT Goal Formulation: With patient Time For Goal Achievement: 11/23/17 Potential to Achieve Goals: Good  Plan Discharge plan remains appropriate;Frequency remains appropriate    Co-evaluation                 AM-PAC PT "6 Clicks" Daily Activity     Outcome Measure   Help from another person eating meals?: None Help from another person taking care of  personal grooming?: A Little Help from another person toileting, which includes using toliet, bedpan, or urinal?: A Little Help from another person bathing (including washing, rinsing, drying)?: A Little Help from another person to put on and taking off regular upper body clothing?: None Help from another person to put on and taking off regular lower body clothing?: A Little 6 Click Score: 20    End of Session Equipment Utilized During Treatment: Rolling walker;Back brace  OT Visit Diagnosis: Other abnormalities of gait and mobility (R26.89);Pain Pain - Right/Left: Left Pain - part of body: Leg   Activity Tolerance Patient tolerated treatment well   Patient Left Other (comment);with nursing/sitter in room;with call bell/phone within reach(nurse in room with patient )   Nurse Communication Mobility status;Patient requests pain meds        Time: 8119-1478 OT Time Calculation (min): 18 min  Charges: OT General Charges $OT Visit: 1 Visit OT Treatments $Self Care/Home Management :  8-22 mins  Chancy Milroyhristie S Kestrel Mis, OTR/L  Pager [phone]  Chancy MilroyChristie S Gaylon Melchor 11/10/2017, 1:31 PM

## 2017-11-10 NOTE — Progress Notes (Signed)
     Subjective: 2 Days Post-Op Procedure(s) (LRB): Transforaminal lumbar interbody fusion right L3-4, L4-5, L5-S1, Bilateral L5-S1 Decompression, pedicle screws and rods L3 to S1, cages L3-4, L4-5 and L5-S1, local bone graft, allograft bone graft and Vivigen (N/A)Awake, alert and oriented x 4. Area over chin with pressure sore, left leg with some mild discomfort, left upper buttock and into the lateral left leg. BM yesterday blood tinged,history of colitis. WBC is slightly elevated. 20K. Low grade temp 99. Uses metformin for polycystic ovary not to diabetes.  Patient reports pain as moderate.    Objective:   VITALS:  Temp:  [98.4 F (36.9 C)-99 F (37.2 C)] 98.4 F (36.9 C) (06/13 0427) Pulse Rate:  [87-114] 87 (06/13 0427) Resp:  [16] 16 (06/13 0427) BP: (110-113)/(52-62) 113/62 (06/13 0427) SpO2:  [98 %-100 %] 98 % (06/13 0427)  Neurologically intact ABD soft Neurovascular intact Sensation intact distally Intact pulses distally Dorsiflexion/Plantar flexion intact Incision: dressing C/D/I and no drainage   LABS Recent Labs    11/09/17 0727 11/09/17 1339 11/10/17 0534  HGB 8.6* 8.6* 7.5*  WBC 20.1*  --  20.0*  PLT 268  --  254   Recent Labs    11/09/17 0727 11/10/17 0534  NA 138 137  K 4.2 3.8  CL 107 106  CO2 24 23  BUN 11 8  CREATININE 0.70 0.62  GLUCOSE 130* 137*   No results for input(s): LABPT, INR in the last 72 hours.   Assessment/Plan: 2 Days Post-Op Procedure(s) (LRB): Transforaminal lumbar interbody fusion right L3-4, L4-5, L5-S1, Bilateral L5-S1 Decompression, pedicle screws and rods L3 to S1, cages L3-4, L4-5 and L5-S1, local bone graft, allograft bone graft and Vivigen (N/A)  Anemia peri op blood loss. Colitis, some blood tinged stool she reports is not unsual for her. Polycystic ovaries, takes metformin, will continue to hold but stop blood glucose checks and SSI.   Advance diet Up with therapy D/C IV fluids  Check H/H this afternoon and  transfuse if symptomatic or if there is significant orthostatic changes in VS. Stop diabetes protocol, she does not have diabetes but still can not use metformin. Start ferrous gluconate for significant anemia. Uses immodium of colitis symptoms.   Vira BrownsJames Susan Bleich 11/10/2017, 8:27 AMPatient ID: Rachel HyKelly Mulligan, female   DOB: 1976/05/23, 42 y.o.   MRN: [id number]

## 2017-11-11 LAB — TYPE AND SCREEN
ABO/RH(D): O POS
ANTIBODY SCREEN: NEGATIVE
UNIT DIVISION: 0
Unit division: 0

## 2017-11-11 LAB — HEMOGLOBIN AND HEMATOCRIT, BLOOD
HEMATOCRIT: 30.7 % — AB (ref 36.0–46.0)
Hemoglobin: 9.9 g/dL — ABNORMAL LOW (ref 12.0–15.0)

## 2017-11-11 LAB — BPAM RBC
Blood Product Expiration Date: 201907072359
Blood Product Expiration Date: 201907112359
ISSUE DATE / TIME: 201906131807
ISSUE DATE / TIME: 201906132305
UNIT TYPE AND RH: 5100
Unit Type and Rh: 5100

## 2017-11-11 LAB — GLUCOSE, CAPILLARY: GLUCOSE-CAPILLARY: 87 mg/dL (ref 65–99)

## 2017-11-11 MED ORDER — METHOCARBAMOL 500 MG PO TABS: 500.0000 mg | ORAL_TABLET | Freq: Four times a day (QID) | ORAL | 0 refills | Status: DC | PRN

## 2017-11-11 MED ORDER — OXYCODONE-ACETAMINOPHEN 7.5-325 MG PO TABS: 1.0000 | ORAL_TABLET | Freq: Four times a day (QID) | ORAL | 0 refills | Status: DC | PRN

## 2017-11-11 NOTE — Progress Notes (Signed)
Written and verbal discharge instructions provided to the patient.  Patient awaiting family member to arrive to take her home

## 2017-11-11 NOTE — Progress Notes (Signed)
Physical Therapy Treatment Patient Details Name: Rachel French MRN: [id number] DOB: 10-Mar-1976 Today's Date: 11/11/2017    History of Present Illness Pt is a 42 y/o female s/p TLIF L3-S1. PMH including but not limited to PCOS, spinal stenosis and posterior cervical fusion C6-C7 in 2016.    PT Comments    Pt making steady progress with functional mobility and tolerated ambulating an increased distance this session. Pt also tolerated stair training with min A to stability RW going backwards. Pt would benefit from an additional stair training session with a family member later today prior to d/c. Pt would continue to benefit from skilled physical therapy services at this time while admitted and after d/c to address the below listed limitations in order to improve overall safety and independence with functional mobility.    Follow Up Recommendations  Supervision - Intermittent;Other (comment)(OP PT when cleared by MD)     Equipment Recommendations  Rolling walker with 5" wheels    Recommendations for Other Services       Precautions / Restrictions Precautions Precautions: Fall;Back Precaution Booklet Issued: No Precaution Comments: Reviewed back precautions and provided cues as needed.  Required Braces or Orthoses: Spinal Brace Spinal Brace: Lumbar corset;Applied in sitting position Restrictions Weight Bearing Restrictions: No    Mobility  Bed Mobility Overal bed mobility: Needs Assistance Bed Mobility: Rolling;Sidelying to Sit;Sit to Sidelying Rolling: Supervision Sidelying to sit: Supervision     Sit to sidelying: Supervision General bed mobility comments: supervision for safety, good log roll technique  Transfers Overall transfer level: Needs assistance Equipment used: Rolling walker (2 wheeled) Transfers: Sit to/from Stand Sit to Stand: Supervision         General transfer comment: supervision for safety  Ambulation/Gait Ambulation/Gait assistance: Min  guard Gait Distance (Feet): 150 Feet Assistive device: Rolling walker (2 wheeled) Gait Pattern/deviations: Step-through pattern;Decreased stride length Gait velocity: decreased Gait velocity interpretation: 1.31 - 2.62 ft/sec, indicative of limited community ambulator General Gait Details: pt with steady gait with RW and min guard for safety, no knee buckling this session   Stairs Stairs: Yes Stairs assistance: Min assist Stair Management: No rails;Step to pattern;Backwards;With walker Number of Stairs: 2 General stair comments: assistance to stabilize RW, cueing for sequencing and technique   Wheelchair Mobility    Modified Rankin (Stroke Patients Only)       Balance Overall balance assessment: Needs assistance Sitting-balance support: Feet supported Sitting balance-Leahy Scale: Good     Standing balance support: During functional activity Standing balance-Leahy Scale: Fair                              Cognition Arousal/Alertness: Awake/alert Behavior During Therapy: WFL for tasks assessed/performed Overall Cognitive Status: Within Functional Limits for tasks assessed                                        Exercises      General Comments        Pertinent Vitals/Pain Pain Assessment: Faces Faces Pain Scale: Hurts a little bit Pain Location: L LE Pain Descriptors / Indicators: Sore Pain Intervention(s): Monitored during session;Repositioned    Home Living                      Prior Function            PT Goals (  current goals can now be found in the care plan section) Acute Rehab PT Goals PT Goal Formulation: With patient Time For Goal Achievement: 11/23/17 Potential to Achieve Goals: Good Progress towards PT goals: Progressing toward goals    Frequency    Min 5X/week      PT Plan Current plan remains appropriate    Co-evaluation              AM-PAC PT "6 Clicks" Daily Activity  Outcome  Measure  Difficulty turning over in bed (including adjusting bedclothes, sheets and blankets)?: None Difficulty moving from lying on back to sitting on the side of the bed? : A Little Difficulty sitting down on and standing up from a chair with arms (e.g., wheelchair, bedside commode, etc,.)?: Unable Help needed moving to and from a bed to chair (including a wheelchair)?: A Little Help needed walking in hospital room?: A Little Help needed climbing 3-5 steps with a railing? : A Little 6 Click Score: 17    End of Session Equipment Utilized During Treatment: Gait belt;Back brace Activity Tolerance: Patient tolerated treatment well Patient left: in bed;with call bell/phone within reach Nurse Communication: Mobility status PT Visit Diagnosis: Other abnormalities of gait and mobility (R26.89);Pain Pain - part of body: (back)     Time: [id number] PT Time Calculation (min) (ACUTE ONLY): 19 min  Charges:  $Gait Training: 8-22 mins                    G Codes:       [place]ennifer Kenlei Safi, [place], [place] [phone]    Alessandra BevelsJennifer M Valdemar Mcclenahan 11/11/2017, 10:18 AM

## 2017-11-11 NOTE — Progress Notes (Signed)
     Subjective: 3 Days Post-Op Procedure(s) (LRB): Transforaminal lumbar interbody fusion right L3-4, L4-5, L5-S1, Bilateral L5-S1 Decompression, pedicle screws and rods L3 to S1, cages L3-4, L4-5 and L5-S1, local bone graft, allograft bone graft and Vivigen (N/A) Awake, alert and oriented x 4. Received 2 units of PRBCs for Hgb 7.1, HR 109 and decreased BP. Now she has more energy, up with PT, stairs today.   Patient reports pain as moderate.    Objective:   VITALS:  Temp:  [98.2 F (36.8 C)-99.5 F (37.5 C)] 98.4 F (36.9 C) (06/14 0431) Pulse Rate:  [93-115] 102 (06/14 0431) Resp:  [16-18] 16 (06/14 0431) BP: (91-126)/(50-80) 118/80 (06/14 0431) SpO2:  [95 %-100 %] 99 % (06/14 0431)  Neurologically intact ABD soft Neurovascular intact Sensation intact distally Intact pulses distally Dorsiflexion/Plantar flexion intact Incision: dressing C/D/I   LABS Recent Labs    11/09/17 0727 11/09/17 1339 11/10/17 0534 11/10/17 1407 11/11/17 0440  HGB 8.6* 8.6* 7.5* 7.1* 9.9*  WBC 20.1*  --  20.0* 17.8*  --   PLT 268  --  254 235  --    Recent Labs    11/09/17 0727 11/10/17 0534  NA 138 137  K 4.2 3.8  CL 107 106  CO2 24 23  BUN 11 8  CREATININE 0.70 0.62  GLUCOSE 130* 137*   No results for input(s): LABPT, INR in the last 72 hours.   Assessment/Plan: 3 Days Post-Op Procedure(s) (LRB): Transforaminal lumbar interbody fusion right L3-4, L4-5, L5-S1, Bilateral L5-S1 Decompression, pedicle screws and rods L3 to S1, cages L3-4, L4-5 and L5-S1, local bone graft, allograft bone graft and Vivigen (N/A)  Anemia due to operative blood loss.  Advance diet Up with therapy  Will assess this afternoon and like discharge at that time if she remains stable.  Vira BrownsJames Nitka 11/11/2017, 8:50 AMPatient ID: Rachel French, female   DOB: 05-06-1976, 42 y.o.   MRN: [id number]

## 2017-11-11 NOTE — Progress Notes (Signed)
Occupational Therapy Treatment Patient Details Name: Rachel French MRN: [id number] DOB: 05-13-1976 Today's Date: 11/11/2017    History of present illness Pt is a 42 y/o female s/p TLIF L3-S1. PMH including but not limited to PCOS, spinal stenosis and posterior cervical fusion C6-C7 in 2016.   OT comments  Patient pleasant and cooperative.  Progressing well.  Intermittent discomfort in L LE today, but controlled well with repositioning and mobility.  Patient able to complete toilet and simulated tub transfers using RW with min guard assistance for safety and balance. Improving activity tolerance.  Patient will be discharging home with 24/7 support from husband and daughter.  Has no further questions or concerns for dc.  Patient able to verbalize and teach back precautions and modifications required to adhere to spinal precautions in regards to ADL participation.  Will continue to follow while admitted.    Follow Up Recommendations  No OT follow up;Supervision/Assistance - 24 hour    Equipment Recommendations  3 in 1 bedside commode    Recommendations for Other Services      Precautions / Restrictions Precautions Precautions: Fall;Back Precaution Booklet Issued: No Precaution Comments: Reviewed back precautions and provided cues as needed.  Required Braces or Orthoses: Spinal Brace Spinal Brace: Lumbar corset;Applied in sitting position(adjusted in standing ) Restrictions Weight Bearing Restrictions: No       Mobility Bed Mobility               General bed mobility comments: seated EOB upon therapist entry  Transfers Overall transfer level: Needs assistance Equipment used: Rolling walker (2 wheeled) Transfers: Sit to/from Stand Sit to Stand: Supervision         General transfer comment: supervision for safety    Balance Overall balance assessment: Needs assistance Sitting-balance support: Feet supported Sitting balance-Leahy Scale: Good     Standing balance  support: During functional activity Standing balance-Leahy Scale: Fair Standing balance comment: Able to statically stand at sink for grooming tasks                           ADL either performed or assessed with clinical judgement   ADL Overall ADL's : Needs assistance/impaired     Grooming: Supervision/safety;Standing Grooming Details (indicate cue type and reason): standing at sink to wash hands          Upper Body Dressing : Set up;Sitting Upper Body Dressing Details (indicate cue type and reason): donning brace with SU assist Lower Body Dressing: Sit to/from stand;Supervision/safety Lower Body Dressing Details (indicate cue type and reason): no pain with figure 4 positioning, able to don and doff B socks with SPV Toilet Transfer: Min IT sales professional Details (indicate cue type and reason): Min guard assist with RW, cueing for hand placement with transfere  Toileting- Clothing Manipulation and Hygiene: Sit to/from stand;Supervision/safety;Adhering to back precautions Toileting - Clothing Manipulation Details (indicate cue type and reason): close supervision for safety with toileting  Tub/ Shower Transfer: Tub transfer;Ambulation;3 in 1;Rolling walker;Min guard Tub/Shower Transfer Details (indicate cue type and reason): simulated in room, completed with 3:1 as shower seat; reviewed pacing and resting as needed; completed with min guard and no losses of balance today  Functional mobility during ADLs: Min guard;Rolling walker General ADL Comments: Pt reviewed back precautions related to ADL as well as compensatory strategies for LB ADL, toileting tasks, and shower transfers. no cueing to adhere to precautions today      Vision Baseline Vision/History: Wears glasses Wears  Glasses: At all times Patient Visual Report: No change from baseline Vision Assessment?: No apparent visual deficits   Perception     Praxis      Cognition Arousal/Alertness:  Awake/alert Behavior During Therapy: WFL for tasks assessed/performed Overall Cognitive Status: Within Functional Limits for tasks assessed                                          Exercises     Shoulder Instructions       General Comments      Pertinent Vitals/ Pain       Faces Pain Scale: Hurts a little bit Pain Location: L LE Pain Descriptors / Indicators: Sore Pain Intervention(s): Monitored during session;Repositioned  Home Living                                          Prior Functioning/Environment              Frequency  Min 2X/week        Progress Toward Goals  OT Goals(current goals can now be found in the care plan section)  Progress towards OT goals: Progressing toward goals  Acute Rehab OT Goals Patient Stated Goal: return home OT Goal Formulation: With patient Time For Goal Achievement: 11/23/17 Potential to Achieve Goals: Good  Plan Discharge plan remains appropriate;Frequency remains appropriate    Co-evaluation                 AM-PAC PT "6 Clicks" Daily Activity     Outcome Measure   Help from another person eating meals?: None Help from another person taking care of personal grooming?: A Little Help from another person toileting, which includes using toliet, bedpan, or urinal?: A Little Help from another person bathing (including washing, rinsing, drying)?: A Little Help from another person to put on and taking off regular upper body clothing?: None Help from another person to put on and taking off regular lower body clothing?: A Little 6 Click Score: 20    End of Session Equipment Utilized During Treatment: Rolling walker;Back brace;Gait belt  OT Visit Diagnosis: Other abnormalities of gait and mobility (R26.89);Pain Pain - Right/Left: Left Pain - part of body: Leg   Activity Tolerance Patient tolerated treatment well   Patient Left in chair;with call bell/phone within reach   Nurse  Communication Mobility status        Time: [id number] OT Time Calculation (min): 22 min  Charges: OT Treatments $Self Care/Home Management : 8-22 mins  Chancy Milroyhristie S Aarohi Redditt, OTR/L  Pager [phone]    Chancy MilroyChristie S Kailah Pennel 11/11/2017, 3:48 PM

## 2017-11-11 NOTE — Progress Notes (Signed)
Physical Therapy Treatment Patient Details Name: Rachel French MRN: [id number] DOB: [birth year] Today's Date: 11/11/2017    History of Present Illness Pt is a 42 y/o female s/p TLIF L3-S1. PMH including but not limited to PCOS, spinal stenosis and posterior cervical fusion C6-C7 in 2016.    PT Comments    Pt's husband present for stair training this PM. Pt ambulated in hallway with RW and min guard, asymptomatic throughout. Pt is ready to d/c home from PT perspective.  Pt would continue to benefit from skilled physical therapy services at this time while admitted and after d/c to address the below listed limitations in order to improve overall safety and independence with functional mobility.    Follow Up Recommendations  Supervision - Intermittent;Other (comment)(OP PT when cleared by MD)     Equipment Recommendations  Rolling walker with 5" wheels    Recommendations for Other Services       Precautions / Restrictions Precautions Precautions: Fall;Back Precaution Booklet Issued: No Precaution Comments: Reviewed back precautions and provided cues as needed.  Required Braces or Orthoses: Spinal Brace Spinal Brace: Lumbar corset;Applied in sitting position Restrictions Weight Bearing Restrictions: No    Mobility  Bed Mobility Overal bed mobility: Modified Independent             General bed mobility comments: seated EOB upon therapist entry  Transfers Overall transfer level: Needs assistance Equipment used: Rolling walker (2 wheeled) Transfers: Sit to/from Stand Sit to Stand: Supervision         General transfer comment: supervision for safety  Ambulation/Gait Ambulation/Gait assistance: Min guard Gait Distance (Feet): 150 Feet Assistive device: Rolling walker (2 wheeled) Gait Pattern/deviations: Step-through pattern;Decreased stride length Gait velocity: decreased Gait velocity interpretation: <1.31 ft/sec, indicative of household ambulator General Gait  Details: pt with steady gait with RW and min guard for safety, no knee buckling this session   Stairs Stairs: Yes Stairs assistance: Min assist Stair Management: No rails;Step to pattern;Backwards;With walker Number of Stairs: 2 General stair comments: pt's husband assisted with stabilizing RW   Wheelchair Mobility    Modified Rankin (Stroke Patients Only)       Balance Overall balance assessment: Needs assistance Sitting-balance support: Feet supported Sitting balance-Leahy Scale: Good     Standing balance support: During functional activity Standing balance-Leahy Scale: Fair Standing balance comment: Able to statically stand at sink for grooming tasks                            Cognition Arousal/Alertness: Awake/alert Behavior During Therapy: WFL for tasks assessed/performed Overall Cognitive Status: Within Functional Limits for tasks assessed                                        Exercises      General Comments        Pertinent Vitals/Pain Pain Assessment: Faces Faces Pain Scale: Hurts a little bit Pain Location: L LE Pain Descriptors / Indicators: Sore Pain Intervention(s): Monitored during session;Repositioned    Home Living                      Prior Function            PT Goals (current goals can now be found in the care plan section) Acute Rehab PT Goals Patient Stated Goal: return home PT Goal Formulation: With  patient Time For Goal Achievement: 11/23/17 Potential to Achieve Goals: Good Progress towards PT goals: Progressing toward goals    Frequency    Min 5X/week      PT Plan Current plan remains appropriate    Co-evaluation              AM-PAC PT "6 Clicks" Daily Activity  Outcome Measure  Difficulty turning over in bed (including adjusting bedclothes, sheets and blankets)?: None Difficulty moving from lying on back to sitting on the side of the bed? : A Little Difficulty sitting  down on and standing up from a chair with arms (e.g., wheelchair, bedside commode, etc,.)?: A Little Help needed moving to and from a bed to chair (including a wheelchair)?: None Help needed walking in hospital room?: A Little Help needed climbing 3-5 steps with a railing? : A Little 6 Click Score: 20    End of Session Equipment Utilized During Treatment: Gait belt;Back brace Activity Tolerance: Patient tolerated treatment well Patient left: in bed;with call bell/phone within reach;with family/visitor present Nurse Communication: Mobility status PT Visit Diagnosis: Other abnormalities of gait and mobility (R26.89);Pain Pain - part of body: (back)     Time: 1610-9604 PT Time Calculation (min) (ACUTE ONLY): 14 min  Charges:  $Gait Training: 8-22 mins                    G Codes:       [facility] [place], [place] [phone]    Alessandra Bevels Sacora Hawbaker 11/11/2017, 5:02 PM

## 2017-11-11 NOTE — Progress Notes (Signed)
     Subjective: 3 Days Post-Op Procedure(s) (LRB): Transforaminal lumbar interbody fusion right L3-4, L4-5, L5-S1, Bilateral L5-S1 Decompression, pedicle screws and rods L3 to S1, cages L3-4, L4-5 and L5-S1, local bone graft, allograft bone graft and Vivigen (N/A)Awake, alert and oriented x 4. In good spirits. Post 2 units transfused, did well with PT. She is ready to go home.   Patient reports pain as moderate.    Objective:   VITALS:  Temp:  [98.2 F (36.8 C)-99.5 F (37.5 C)] 98.3 F (36.8 C) (06/14 1354) Pulse Rate:  [93-115] 105 (06/14 1354) Resp:  [16-18] 16 (06/14 1354) BP: (107-126)/(56-80) 119/64 (06/14 1354) SpO2:  [95 %-100 %] 100 % (06/14 1354)  Neurologically intact ABD soft Neurovascular intact Sensation intact distally Intact pulses distally Dorsiflexion/Plantar flexion intact Incision: dressing C/D/I   LABS Recent Labs    11/09/17 0727 11/09/17 1339 11/10/17 0534 11/10/17 1407 11/11/17 0440  HGB 8.6* 8.6* 7.5* 7.1* 9.9*  WBC 20.1*  --  20.0* 17.8*  --   PLT 268  --  254 235  --    Recent Labs    11/09/17 0727 11/10/17 0534  NA 138 137  K 4.2 3.8  CL 107 106  CO2 24 23  BUN 11 8  CREATININE 0.70 0.62  GLUCOSE 130* 137*   No results for input(s): LABPT, INR in the last 72 hours.   Assessment/Plan: 3 Days Post-Op Procedure(s) (LRB): Transforaminal lumbar interbody fusion right L3-4, L4-5, L5-S1, Bilateral L5-S1 Decompression, pedicle screws and rods L3 to S1, cages L3-4, L4-5 and L5-S1, local bone graft, allograft bone graft and Vivigen (N/A) Anemia due to operative blood losss.   Advance diet Up with therapy D/C IV fluids Discharge home with home health  Rachel BrownsJames Gerrad French 11/11/2017, 4:27 PMPatient ID: Rachel French, female   DOB: 03-Apr-1976, 42 y.o.   MRN: [id number]

## 2017-11-11 NOTE — Progress Notes (Signed)
Subjective: Patient doing well this afternoon.  She had transfusion packed red blood cells yesterday.  Currently she is denying any feeling of lightheadedness or dizziness.  She has had some episodes of sharp shooting pain in the left lower extremity but patient states leg pain is better than preop.  Intermittent shooting pain does not last long.  No complaints of incontinence.  She did well with PT this morning.   Objective: Vital signs in last 24 hours: Temp:  [98.2 F (36.8 C)-99.5 F (37.5 C)] 98.3 F (36.8 C) (06/14 1354) Pulse Rate:  [93-115] 105 (06/14 1354) Resp:  [16-18] 16 (06/14 1354) BP: (107-126)/(56-80) 119/64 (06/14 1354) SpO2:  [95 %-100 %] 100 % (06/14 1354)  Intake/Output from previous day: 06/13 0701 - 06/14 0700 In: 5187.1 [P.O.:840; I.V.:3997.1; Blood:350] Out: -  Intake/Output this shift: Total I/O In: 3 [I.V.:3] Out: -   Recent Labs    11/09/17 0727 11/09/17 1339 11/10/17 0534 11/10/17 1407 11/11/17 0440  HGB 8.6* 8.6* 7.5* 7.1* 9.9*   Recent Labs    11/10/17 0534 11/10/17 1407 11/11/17 0440  WBC 20.0* 17.8*  --   RBC 2.40* 2.24*  --   HCT 23.2* 21.9* 30.7*  PLT 254 235  --    Recent Labs    11/09/17 0727 11/10/17 0534  NA 138 137  K 4.2 3.8  CL 107 106  CO2 24 23  BUN 11 8  CREATININE 0.70 0.62  GLUCOSE 130* 137*  CALCIUM 7.8* 7.5*   No results for input(s): LABPT, INR in the last 72 hours.  Exam Pleasant white female alert and oriented x3 in no acute distress.  Her dressing changed by me this afternoon.  Surgical incision looks good.  No drainage or signs of infection.  Steri-Strips intact.  She does roll and move on her bed very well without assistance and no complications.  Neurovascular intact.  No focal motor deficits.  Bilateral calves are nontender.      Assessment/Plan: Patient making progress.  I explained to her that the intermittent shooting pains in the left lower extremity can be expected at this point due to the  significant amount of compression that she had and also the amount of surgery that we did.  She voices understanding.  At this point she does not appear to have a symptomatic anemia but will have PT OT get her up one more time this afternoon and we will plan to discharge home as long as she is still asymptomatic.  All questions answered.  Prescriptions on chart for pain medication and muscle relaxer.   Zonia KiefJames Owens 11/11/2017, 2:30 PM
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
## 2017-11-18 ENCOUNTER — Telehealth (INDEPENDENT_AMBULATORY_CARE_PROVIDER_SITE_OTHER): Payer: Self-pay

## 2017-11-18 NOTE — Telephone Encounter (Signed)
Patient called triage line stating she was having some lower extremity swelling Right worse than left .  She did say that she likely has been more active than she should be. She does deny any calf tenderness C/o legs being sore at the end of the day feeling like she has completed a marathon.  She stated she was concerned that she had a blood clot. I advised unable to advise her of that but if she was concerned, could go to ER and dopplers could be done to r/o DVT-patient description of the symptoms she was having was very vague and inconsistent. She stated she will go to ER for eval of DVT

## 2017-11-21 ENCOUNTER — Other Ambulatory Visit: Payer: Self-pay | Admitting: Family Medicine

## 2017-11-21 NOTE — Telephone Encounter (Signed)
Agree, eval in ER  And probable dopplers is appropriate. Thanks for referring her to ER, even vascular surgeons are only correct 50% of the time with clinical decision as to whether a blood clot is present so dopplers are necessary. jen

## 2017-11-25 ENCOUNTER — Ambulatory Visit (INDEPENDENT_AMBULATORY_CARE_PROVIDER_SITE_OTHER): Payer: Self-pay

## 2017-11-25 ENCOUNTER — Ambulatory Visit (INDEPENDENT_AMBULATORY_CARE_PROVIDER_SITE_OTHER): Payer: Medicaid Other | Admitting: Specialist

## 2017-11-25 ENCOUNTER — Encounter (INDEPENDENT_AMBULATORY_CARE_PROVIDER_SITE_OTHER): Payer: Self-pay | Admitting: Specialist

## 2017-11-25 VITALS — BP 116/81 | HR 96 | Ht 61.0 in | Wt 205.0 lb

## 2017-11-25 DIAGNOSIS — M5136 Other intervertebral disc degeneration, lumbar region: Secondary | ICD-10-CM | POA: Diagnosis not present

## 2017-11-25 DIAGNOSIS — Z981 Arthrodesis status: Secondary | ICD-10-CM

## 2017-11-25 DIAGNOSIS — M5416 Radiculopathy, lumbar region: Secondary | ICD-10-CM

## 2017-11-25 MED ORDER — OXYCODONE-ACETAMINOPHEN 7.5-325 MG PO TABS: 1.0000 | ORAL_TABLET | Freq: Four times a day (QID) | ORAL | 0 refills | Status: DC | PRN
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
## 2017-11-25 NOTE — Patient Instructions (Addendum)
Plan: Avoid frequent bending and stooping  No lifting greater than 10 lbs. May use ice or moist heat for pain. Weight loss is of benefit. Handicap license is approved. Calcium 1500 mg per day Vitamin D 800 IU per day Vit b6,Vit B12, folic acid, thiamine, creel oil.

## 2017-12-02 ENCOUNTER — Other Ambulatory Visit: Payer: Self-pay | Admitting: Family Medicine

## 2017-12-02 NOTE — Telephone Encounter (Signed)
Requesting refill    Vyvanse  LOV: 07/13/17 LRF:  10/25/17

## 2017-12-03 MED ORDER — LISDEXAMFETAMINE DIMESYLATE 50 MG PO CAPS: 50.0000 mg | ORAL_CAPSULE | Freq: Every day | ORAL | 0 refills | Status: DC

## 2017-12-16 ENCOUNTER — Other Ambulatory Visit: Payer: Self-pay | Admitting: Family Medicine

## 2018-01-12 ENCOUNTER — Ambulatory Visit (INDEPENDENT_AMBULATORY_CARE_PROVIDER_SITE_OTHER): Payer: Medicaid Other | Admitting: Specialist

## 2018-01-12 ENCOUNTER — Encounter (INDEPENDENT_AMBULATORY_CARE_PROVIDER_SITE_OTHER): Payer: Self-pay | Admitting: Specialist

## 2018-01-12 ENCOUNTER — Ambulatory Visit (INDEPENDENT_AMBULATORY_CARE_PROVIDER_SITE_OTHER): Payer: Medicaid Other

## 2018-01-12 ENCOUNTER — Other Ambulatory Visit: Payer: Self-pay | Admitting: Family Medicine

## 2018-01-12 VITALS — BP 124/85 | HR 104 | Ht 61.0 in | Wt 205.0 lb

## 2018-01-12 DIAGNOSIS — Z981 Arthrodesis status: Secondary | ICD-10-CM

## 2018-01-12 DIAGNOSIS — M51369 Other intervertebral disc degeneration, lumbar region without mention of lumbar back pain or lower extremity pain: Secondary | ICD-10-CM

## 2018-01-12 DIAGNOSIS — M5136 Other intervertebral disc degeneration, lumbar region: Secondary | ICD-10-CM
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
## 2018-01-12 NOTE — Patient Instructions (Signed)
Avoid frequent bending and stooping  No lifting greater than 10 lbs. May use ice or moist heat for pain. Weight loss is of benefit. Handicap license is approved.   

## 2018-01-12 NOTE — Telephone Encounter (Signed)
Ok to refill??  Last office visit 07/13/2017.   Last refill 12/03/2017.

## 2018-01-13 MED ORDER — LISDEXAMFETAMINE DIMESYLATE 50 MG PO CAPS: 50.0000 mg | ORAL_CAPSULE | Freq: Every day | ORAL | 0 refills | Status: DC

## 2018-02-16 ENCOUNTER — Other Ambulatory Visit: Payer: Self-pay | Admitting: Family Medicine

## 2018-02-16 MED ORDER — LISDEXAMFETAMINE DIMESYLATE 50 MG PO CAPS: 50.0000 mg | ORAL_CAPSULE | Freq: Every day | ORAL | 0 refills | Status: DC

## 2018-02-16 NOTE — Telephone Encounter (Signed)
Ok to refill??  Last office visit 07/13/2017.  Last refill 01/13/2018.

## 2018-02-27 ENCOUNTER — Ambulatory Visit (INDEPENDENT_AMBULATORY_CARE_PROVIDER_SITE_OTHER): Payer: Medicaid Other

## 2018-02-27 ENCOUNTER — Ambulatory Visit (INDEPENDENT_AMBULATORY_CARE_PROVIDER_SITE_OTHER): Payer: Medicaid Other | Admitting: Specialist

## 2018-02-27 ENCOUNTER — Encounter (INDEPENDENT_AMBULATORY_CARE_PROVIDER_SITE_OTHER): Payer: Self-pay | Admitting: Specialist

## 2018-02-27 VITALS — BP 125/84 | HR 93 | Ht 61.0 in | Wt 205.0 lb

## 2018-02-27 DIAGNOSIS — Z981 Arthrodesis status: Secondary | ICD-10-CM

## 2018-02-27 DIAGNOSIS — M5416 Radiculopathy, lumbar region: Secondary | ICD-10-CM

## 2018-02-27 NOTE — Patient Instructions (Signed)
Avoid frequent bending and stooping  No lifting greater than 10 lbs. May use ice or moist heat for pain. Weight loss is of benefit. Best medication for lumbar disc disease is arthritis medications like motrin, celebrex and naprosyn. Exercise is important to improve your indurance and does allow people to function better inspite of back pain.  Neurontin at night for nerve pain 1-3 tablet. Vitamin B complex and creel oil.
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
## 2018-03-17 ENCOUNTER — Other Ambulatory Visit: Payer: Self-pay | Admitting: Family Medicine

## 2018-03-20 MED ORDER — LISDEXAMFETAMINE DIMESYLATE 50 MG PO CAPS: 50.0000 mg | ORAL_CAPSULE | Freq: Every day | ORAL | 0 refills | Status: DC

## 2018-03-20 NOTE — Telephone Encounter (Signed)
Requesting refill    Vyvanse  LOV: 05/01/13  LRF:  02/16/18

## 2018-05-01 ENCOUNTER — Other Ambulatory Visit: Payer: Self-pay | Admitting: Family Medicine

## 2018-05-01 MED ORDER — LISDEXAMFETAMINE DIMESYLATE 50 MG PO CAPS: 50.0000 mg | ORAL_CAPSULE | Freq: Every day | ORAL | 0 refills | Status: DC

## 2018-05-01 NOTE — Telephone Encounter (Signed)
Requesting refill      LOV: 07/13/17  LRF: 03/20/18

## 2018-05-01 NOTE — Telephone Encounter (Signed)
Needs OV , will give 1 more refill

## 2018-05-02 NOTE — Telephone Encounter (Signed)
Pt aware via mychart message 

## 2018-05-16 ENCOUNTER — Encounter: Payer: Medicaid Other | Admitting: Family Medicine

## 2018-05-30 ENCOUNTER — Other Ambulatory Visit: Payer: Self-pay

## 2018-05-30 ENCOUNTER — Encounter: Payer: Self-pay | Admitting: Family Medicine

## 2018-05-30 ENCOUNTER — Ambulatory Visit: Payer: Medicaid Other | Admitting: Family Medicine

## 2018-05-30 VITALS — BP 126/80 | HR 78 | Temp 99.0°F | Resp 14 | Ht 61.0 in | Wt 209.0 lb

## 2018-05-30 DIAGNOSIS — F319 Bipolar disorder, unspecified: Secondary | ICD-10-CM

## 2018-05-30 DIAGNOSIS — E7439 Other disorders of intestinal carbohydrate absorption: Secondary | ICD-10-CM

## 2018-05-30 DIAGNOSIS — E669 Obesity, unspecified: Secondary | ICD-10-CM

## 2018-05-30 DIAGNOSIS — E282 Polycystic ovarian syndrome: Secondary | ICD-10-CM

## 2018-05-30 DIAGNOSIS — F418 Other specified anxiety disorders: Secondary | ICD-10-CM

## 2018-05-30 MED ORDER — VALACYCLOVIR HCL 1 G PO TABS: ORAL_TABLET | ORAL | 1 refills | Status: DC

## 2018-05-30 MED ORDER — LISDEXAMFETAMINE DIMESYLATE 50 MG PO CAPS: 50.0000 mg | ORAL_CAPSULE | Freq: Every day | ORAL | 0 refills | Status: DC

## 2018-05-30 MED ORDER — ARIPIPRAZOLE 5 MG PO TABS: 5.0000 mg | ORAL_TABLET | Freq: Every day | ORAL | 1 refills | Status: DC

## 2018-05-30 NOTE — Assessment & Plan Note (Signed)
Will refill her Vyvanse.  Did discuss not taking others medications.  With regards to her bipolar depression we will try her on Abilify will start with 5 mg once a day.  Also discussed going to therapy where her daughter is seen.  Follow back up in a few weeks to see how she is doing on the medications.  If she does have trouble with her insurance she will let us know we will come up with an alternative due to the cost of these medications.

## 2018-05-30 NOTE — Patient Instructions (Signed)
Restart vyvanse We will call with lab results Try the abilify once a day  F/U 6 weeks

## 2018-05-30 NOTE — Progress Notes (Signed)
Subjective:    Patient ID: Rachel French, female    DOB: 08-19-1975, 42 y.o.   MRN: [id number]  Patient presents for Medication Review (is fasting)   Pt here to f/u chronic medical problems   PCOS-  She stopped the metformin in August due to her surgery, Borderline DM, last A1C 5.7%  Depression with axiety- sthe stopped taking her Prozac initially due to some cost issues and states that she was,  feeling better but now she is feeling more depressed.  She is also having more problems with her daughter. When she thinks back she did not think that the Prozac was helping as much and would like to try something different for her bipolar depression.  She denies any suicidal ideations.    ADHD- she has been on vyvanse until this past month, she took her daughters Adderall instead, there ewas some change with her Mediacid, was changed to family planning?   Chronic pain- no longer on percocet or hydrocodone, she still has robaxin, she has appt in Jan 6th  she has not been on gabapentin regulary    Review Of Systems:  GEN- denies fatigue, fever, weight loss,weakness, recent illness HEENT- denies eye drainage, change in vision, nasal discharge, CVS- denies chest pain, palpitations RESP- denies SOB, cough, wheeze ABD- denies N/V, change in stools, abd pain GU- denies dysuria, hematuria, dribbling, incontinence MSK- + joint pain, muscle aches, injury Neuro- denies headache, dizziness, syncope, seizure activity       Objective:    BP 126/80   Pulse 78   Temp 99 F (37.2 C) (Oral)   Resp 14   Ht 5\' 1"  (1.549 m)   Wt 209 lb (94.8 kg)   SpO2 96%   BMI 39.49 kg/m  GEN- NAD, alert and oriented x3, dog odor very strong to clothing HEENT- PERRL, EOMI, non injected sclera, pink conjunctiva, MMM, oropharynx clear Neck- Supple, no thyromegaly CVS- RRR, no murmur RESP-CTAB Psych- normal affect and mood EXT- No edema Pulses- Radial, DP- 2+        Assessment & Plan:      Problem  List Items Addressed This Visit      Unprioritized   Bipolar 1 disorder (HCC)   Depression with anxiety - Primary    Will refill her Vyvanse.  Did discuss not taking others medications.  With regards to her bipolar depression we will try her on Abilify will start with 5 mg once a day.  Also discussed going to therapy where her daughter is seen.  Follow back up in a few weeks to see how she is doing on the medications.  If she does have trouble with her insurance she will let us know we will come up with an alternative due to the cost of these medications.      Obesity (BMI 30-39.9)   Relevant Medications   lisdexamfetamine (VYVANSE) 50 MG capsule   PCOS (polycystic ovarian syndrome)    Setting of her obesity and glucose intolerance we will recheck her A1c will also check a thyroid function Discussed some dietary changes she can make.  She is not exercising secondary to her chronic back pain.      Relevant Orders   CBC with Differential/Platelet   Comprehensive metabolic panel   Hemoglobin A1c   TSH    Other Visit Diagnoses    Glucose intolerance       Relevant Orders   Hemoglobin A1c      Note: This dictation was prepared with  Dragon dictation along with smaller Company secretary. Any transcriptional errors that result from this process are unintentional.

## 2018-05-30 NOTE — Assessment & Plan Note (Addendum)
Setting of her obesity and glucose intolerance we will recheck her A1c will also check a thyroid function Discussed some dietary changes she can make.  She is not exercising secondary to her chronic back pain.

## 2018-05-31 LAB — CBC WITH DIFFERENTIAL/PLATELET
ABSOLUTE MONOCYTES: 1064 {cells}/uL — AB (ref 200–950)
BASOS ABS: 101 {cells}/uL (ref 0–200)
BASOS PCT: 0.9 %
EOS ABS: 504 {cells}/uL — AB (ref 15–500)
Eosinophils Relative: 4.5 %
HCT: 41.5 % (ref 35.0–45.0)
HEMOGLOBIN: 13.9 g/dL (ref 11.7–15.5)
Lymphs Abs: 3629 cells/uL (ref 850–3900)
MCH: 31.5 pg (ref 27.0–33.0)
MCHC: 33.5 g/dL (ref 32.0–36.0)
MCV: 94.1 fL (ref 80.0–100.0)
MONOS PCT: 9.5 %
MPV: 11.3 fL (ref 7.5–12.5)
NEUTROS ABS: 5902 {cells}/uL (ref 1500–7800)
Neutrophils Relative %: 52.7 %
Platelets: 423 10*3/uL — ABNORMAL HIGH (ref 140–400)
RBC: 4.41 10*6/uL (ref 3.80–5.10)
RDW: 12.7 % (ref 11.0–15.0)
Total Lymphocyte: 32.4 %
WBC: 11.2 10*3/uL — ABNORMAL HIGH (ref 3.8–10.8)

## 2018-05-31 LAB — HEMOGLOBIN A1C
EAG (MMOL/L): 7.3 (calc)
HEMOGLOBIN A1C: 6.2 %{Hb} — AB (ref <5.7)
Mean Plasma Glucose: 131 (calc)

## 2018-05-31 LAB — COMPREHENSIVE METABOLIC PANEL
AG RATIO: 1.5 (calc) (ref 1.0–2.5)
ALT: 11 U/L (ref 6–29)
AST: 12 U/L (ref 10–30)
Albumin: 4.4 g/dL (ref 3.6–5.1)
Alkaline phosphatase (APISO): 105 U/L (ref 33–115)
BILIRUBIN TOTAL: 0.5 mg/dL (ref 0.2–1.2)
BUN: 9 mg/dL (ref 7–25)
CHLORIDE: 104 mmol/L (ref 98–110)
CO2: 24 mmol/L (ref 20–32)
Calcium: 9.7 mg/dL (ref 8.6–10.2)
Creat: 0.63 mg/dL (ref 0.50–1.10)
GLOBULIN: 2.9 g/dL (ref 1.9–3.7)
GLUCOSE: 108 mg/dL — AB (ref 65–99)
Potassium: 4.4 mmol/L (ref 3.5–5.3)
Sodium: 136 mmol/L (ref 135–146)
Total Protein: 7.3 g/dL (ref 6.1–8.1)

## 2018-05-31 LAB — TSH: TSH: 2.09 mIU/L

## 2018-06-05 ENCOUNTER — Ambulatory Visit (INDEPENDENT_AMBULATORY_CARE_PROVIDER_SITE_OTHER): Payer: Medicaid Other | Admitting: Specialist

## 2018-06-15 ENCOUNTER — Encounter: Payer: Self-pay | Admitting: *Deleted

## 2018-07-03 ENCOUNTER — Ambulatory Visit (INDEPENDENT_AMBULATORY_CARE_PROVIDER_SITE_OTHER): Payer: Self-pay | Admitting: Specialist

## 2018-07-03 ENCOUNTER — Encounter (INDEPENDENT_AMBULATORY_CARE_PROVIDER_SITE_OTHER): Payer: Self-pay | Admitting: Specialist

## 2018-07-03 ENCOUNTER — Ambulatory Visit (INDEPENDENT_AMBULATORY_CARE_PROVIDER_SITE_OTHER): Payer: Self-pay

## 2018-07-03 VITALS — BP 130/86 | HR 108 | Ht 63.0 in | Wt 209.0 lb

## 2018-07-03 DIAGNOSIS — M545 Low back pain, unspecified: Secondary | ICD-10-CM

## 2018-07-03 DIAGNOSIS — Z981 Arthrodesis status: Secondary | ICD-10-CM

## 2018-07-03 DIAGNOSIS — M5412 Radiculopathy, cervical region: Secondary | ICD-10-CM

## 2018-07-03 NOTE — Patient Instructions (Signed)
Avoid overhead lifting and overhead use of the arms. ?Do not lift greater than 5 lbs. ?Adjust head rest in vehicle to prevent hyperextension if rear ended. ?Take extra precautions to avoid falling. ?Avoid frequent bending and stooping  ?No lifting greater than 10 lbs. ?May use ice or moist heat for pain. ?Weight loss is of benefit. ?Best medication for lumbar disc disease is arthritis medications like motrin, celebrex and naprosyn. ?Exercise is important to improve your indurance and does allow people to function better inspite of back pain. ?  ?
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
## 2018-07-24 ENCOUNTER — Other Ambulatory Visit: Payer: Self-pay | Admitting: *Deleted

## 2018-07-24 ENCOUNTER — Other Ambulatory Visit: Payer: Self-pay | Admitting: Family Medicine

## 2018-07-24 MED ORDER — FLUOXETINE HCL 40 MG PO CAPS: ORAL_CAPSULE | ORAL | 0 refills | Status: DC

## 2018-07-26 ENCOUNTER — Telehealth: Payer: Self-pay | Admitting: *Deleted

## 2018-07-26 ENCOUNTER — Encounter: Payer: Self-pay | Admitting: Family Medicine

## 2018-07-26 NOTE — Telephone Encounter (Signed)
Received request from pharmacy for PA on Abilify.  Formulary alternatives include: Risperdal Geodon Seroquel Zyprexa  PA submitted.   Dx: F31.9.

## 2018-07-26 NOTE — Telephone Encounter (Signed)
[facility] is reviewing your PA request. Typically an electronic response will be received within 72 hours. To check for an update later, open this request from your dashboard.    You may close this dialog and return to your dashboard to perform other tasks.

## 2018-07-26 NOTE — Telephone Encounter (Signed)
Received request from pharmacy for PA on Vyvanse.   Formulary alternatives include: Adderall, Concerta, Metadate, Ritalin  PA submitted.   Dx: L07.8- ADD.

## 2018-07-27 MED ORDER — ARIPIPRAZOLE 5 MG PO TABS: ORAL_TABLET | ORAL | 1 refills | Status: DC

## 2018-07-27 NOTE — Telephone Encounter (Signed)
Received PA determination.   [id number] approved through 07/27/2019.  Pharmacy made aware.

## 2018-08-01 NOTE — Telephone Encounter (Signed)
Not sure what you wrote on PA , but she has been stable on Vyvanse since July 2015 Change in medication will deteriorate  Her condition

## 2018-08-01 NOTE — Telephone Encounter (Signed)
Received PA determination.   PA denied.  The medication is covered only if the paitnet pas failed or cannot take: Brand Adderall Brand Concerta Generic Methylphenidate  (Metadate, Ritalin)  MD please advise.

## 2018-08-02 ENCOUNTER — Encounter: Payer: Self-pay | Admitting: *Deleted

## 2018-08-02 NOTE — Telephone Encounter (Signed)
Appeal faxed

## 2018-08-11 ENCOUNTER — Other Ambulatory Visit (INDEPENDENT_AMBULATORY_CARE_PROVIDER_SITE_OTHER): Payer: Self-pay | Admitting: Specialist

## 2018-08-11 NOTE — Telephone Encounter (Signed)
Call placed to [facility] to inquire about status of appeal. Reports that no appeal noted. Advised that new PA can be filed. Clinical information given and PA approved.   PA- [id number] approved through 08/11/2019. Pharmacy made aware.   Contraindication to: Brand Adderall Brand Concerta Generic Methylphenidate  (Metadate, Ritalin) Vyvanse is a long-acting drug, which means it is released gradually over time. Due to its longer action compared to short-acting stimulants like Adderall, Ritalin, and Focalin, Vyvanse doesn't need to be taken as often, which lowers its risk for abuse. The steady release of Vyvanse throughout the day also means that Vyvanse causes fewer rebound symptoms like over-excitement or irritability that happen when a stimulant wears off.

## 2018-08-23 ENCOUNTER — Other Ambulatory Visit: Payer: Self-pay | Admitting: Family Medicine

## 2018-08-23 MED ORDER — LISDEXAMFETAMINE DIMESYLATE 50 MG PO CAPS: 50.0000 mg | ORAL_CAPSULE | Freq: Every day | ORAL | 0 refills | Status: DC

## 2018-08-23 NOTE — Telephone Encounter (Signed)
Ok to refill??  Last office visit/ refill 05/30/2018.

## 2018-08-29 ENCOUNTER — Encounter: Payer: Self-pay | Admitting: Family Medicine

## 2018-08-30 ENCOUNTER — Ambulatory Visit: Payer: Medicaid Other | Admitting: Family Medicine

## 2018-08-30 ENCOUNTER — Telehealth: Payer: Self-pay | Admitting: Family Medicine

## 2018-08-30 MED ORDER — AMPHETAMINE-DEXTROAMPHET ER 20 MG PO CP24: 20.0000 mg | ORAL_CAPSULE | Freq: Every day | ORAL | 0 refills | Status: DC

## 2018-08-30 MED ORDER — ADDERALL XR 20 MG PO CP24: 20.0000 mg | ORAL_CAPSULE | Freq: Every day | ORAL | 0 refills | Status: DC

## 2018-08-30 NOTE — Telephone Encounter (Signed)
Call placed to pharmacy.   Reports that current co-pay is $240. States that it appears to be deductible.

## 2018-08-30 NOTE — Telephone Encounter (Signed)
Changed to brand name, DAW

## 2018-08-30 NOTE — Telephone Encounter (Signed)
I changed to Adderall 20mg  XR which is equivalent dosing  not sure if this is any cheaper as she has a deductible

## 2018-08-31 ENCOUNTER — Ambulatory Visit (INDEPENDENT_AMBULATORY_CARE_PROVIDER_SITE_OTHER): Payer: Self-pay | Admitting: Family Medicine

## 2018-08-31 ENCOUNTER — Other Ambulatory Visit: Payer: Self-pay

## 2018-08-31 DIAGNOSIS — L237 Allergic contact dermatitis due to plants, except food: Secondary | ICD-10-CM

## 2018-08-31 MED ORDER — PREDNISONE 20 MG PO TABS: ORAL_TABLET | ORAL | 0 refills | Status: DC
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
## 2018-09-05 ENCOUNTER — Encounter: Payer: Self-pay | Admitting: Family Medicine

## 2018-10-02 ENCOUNTER — Ambulatory Visit: Payer: Self-pay | Admitting: Specialist

## 2018-10-06 ENCOUNTER — Encounter: Payer: Self-pay | Admitting: Family Medicine

## 2018-10-06 MED ORDER — ADDERALL XR 20 MG PO CP24: 20.0000 mg | ORAL_CAPSULE | Freq: Every day | ORAL | 0 refills | Status: DC

## 2018-10-06 NOTE — Telephone Encounter (Signed)
Ok to refill??  Last office visit 08/31/2018  Last refill 08/30/2018.

## 2018-11-14 ENCOUNTER — Encounter: Payer: Self-pay | Admitting: Family Medicine

## 2018-11-15 MED ORDER — ADDERALL XR 20 MG PO CP24: 20.0000 mg | ORAL_CAPSULE | Freq: Every day | ORAL | 0 refills | Status: DC

## 2018-11-15 NOTE — Telephone Encounter (Signed)
Ok to refill??  Last office visit 08/31/2018.  Last refill 10/06/2018.

## 2018-12-13 ENCOUNTER — Encounter: Payer: Self-pay | Admitting: Family Medicine

## 2018-12-13 MED ORDER — ADDERALL XR 20 MG PO CP24: 20.0000 mg | ORAL_CAPSULE | Freq: Every day | ORAL | 0 refills | Status: DC

## 2018-12-13 NOTE — Telephone Encounter (Signed)
Ok to refill??  Last office visit 08/31/2018.  Last refill 11/15/2018.

## 2019-01-18 ENCOUNTER — Encounter: Payer: Self-pay | Admitting: Family Medicine

## 2019-01-18 NOTE — Telephone Encounter (Signed)
Ok to refill??  Last office visit 08/31/2018.  Last refill 12/13/2018.

## 2019-01-19 MED ORDER — ADDERALL XR 20 MG PO CP24: 20.0000 mg | ORAL_CAPSULE | Freq: Every day | ORAL | 0 refills | Status: DC

## 2019-02-15 IMAGING — DX DG HAND COMPLETE 3+V*L*
3 series · 3 of 3 positions shown · non-contrast
Comparison: No prior .

CLINICAL DATA: Left index finger laceration.

EXAM:
LEFT HAND - COMPLETE 3+ VIEW

[x hand pa left]
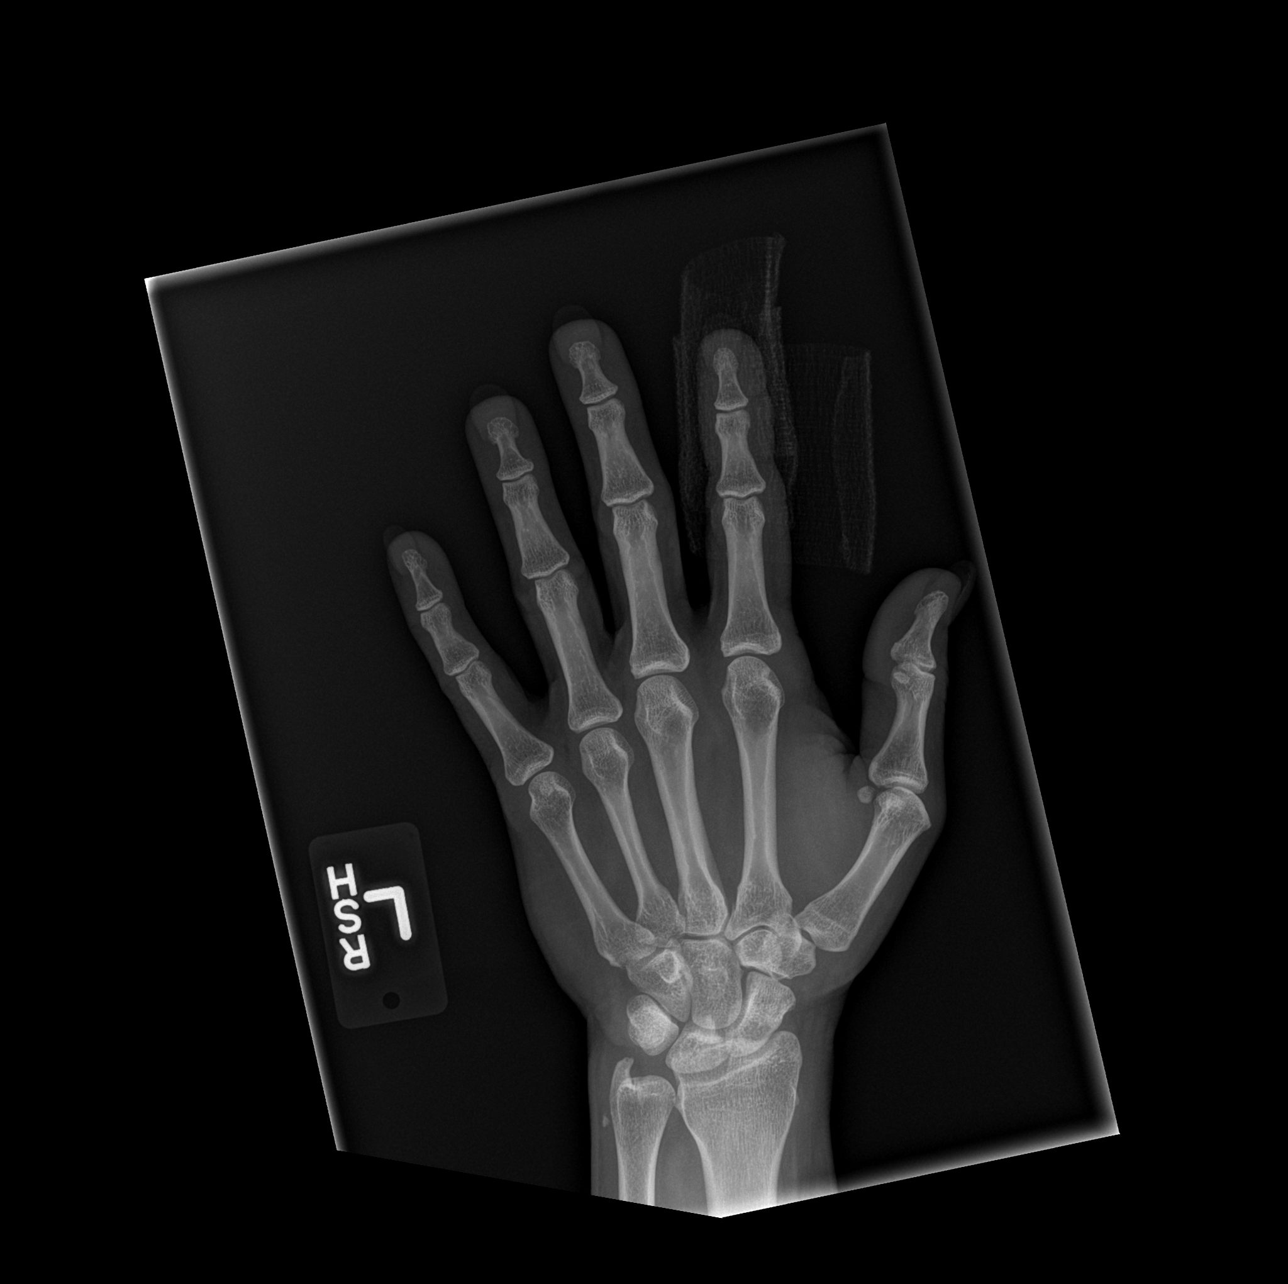

[x hand obl left]
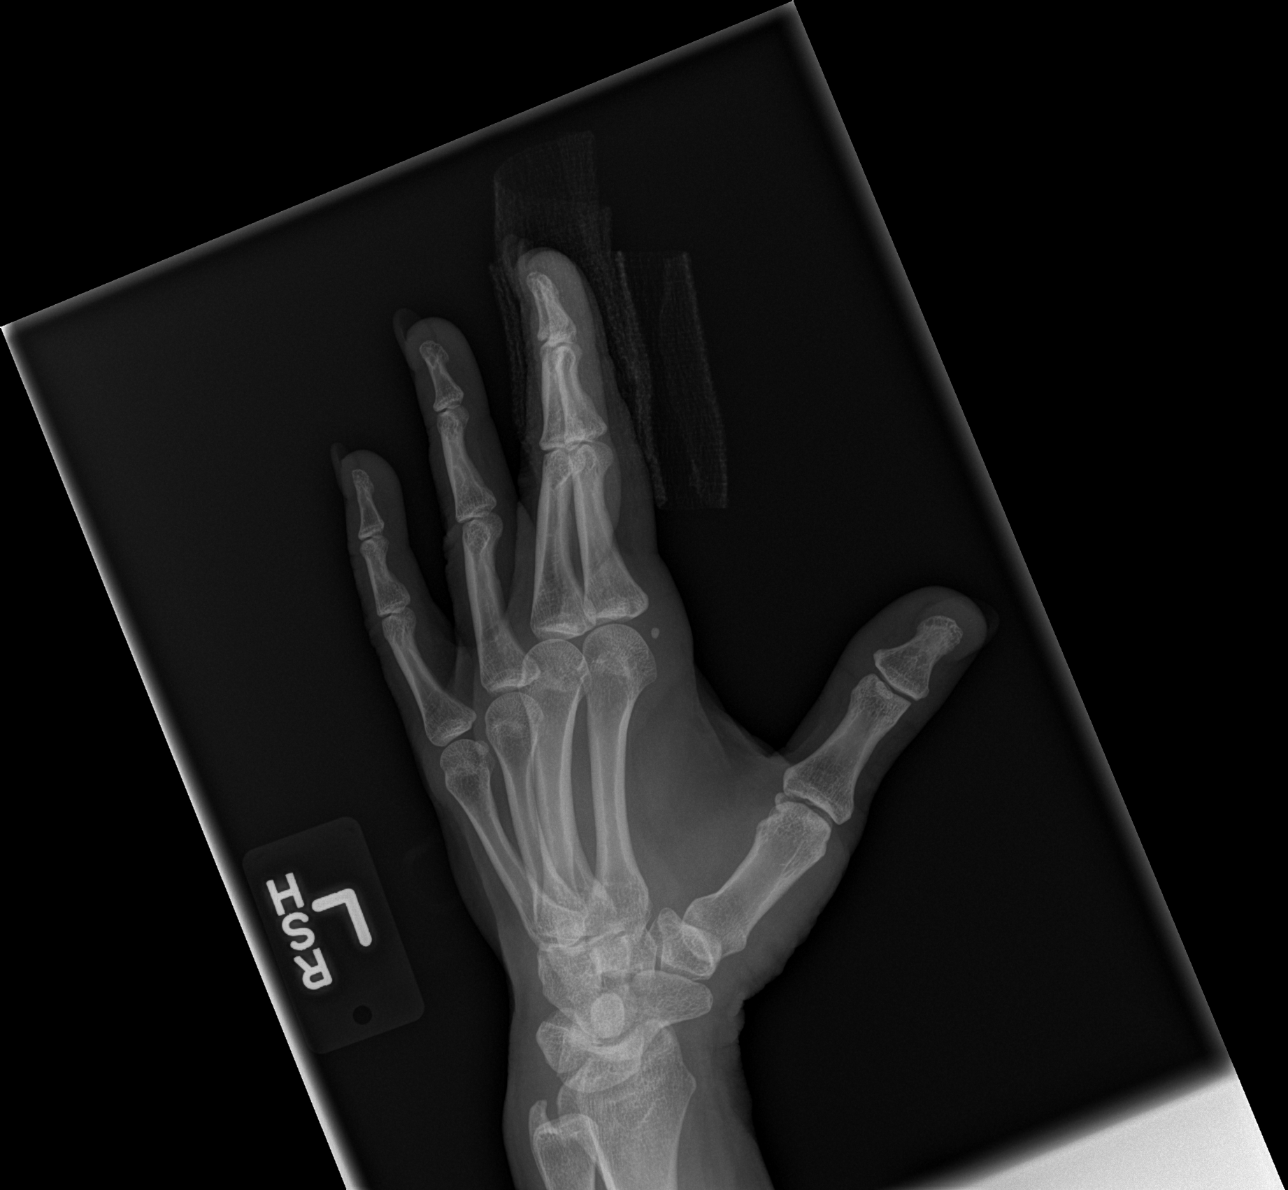

[x hand lat left]
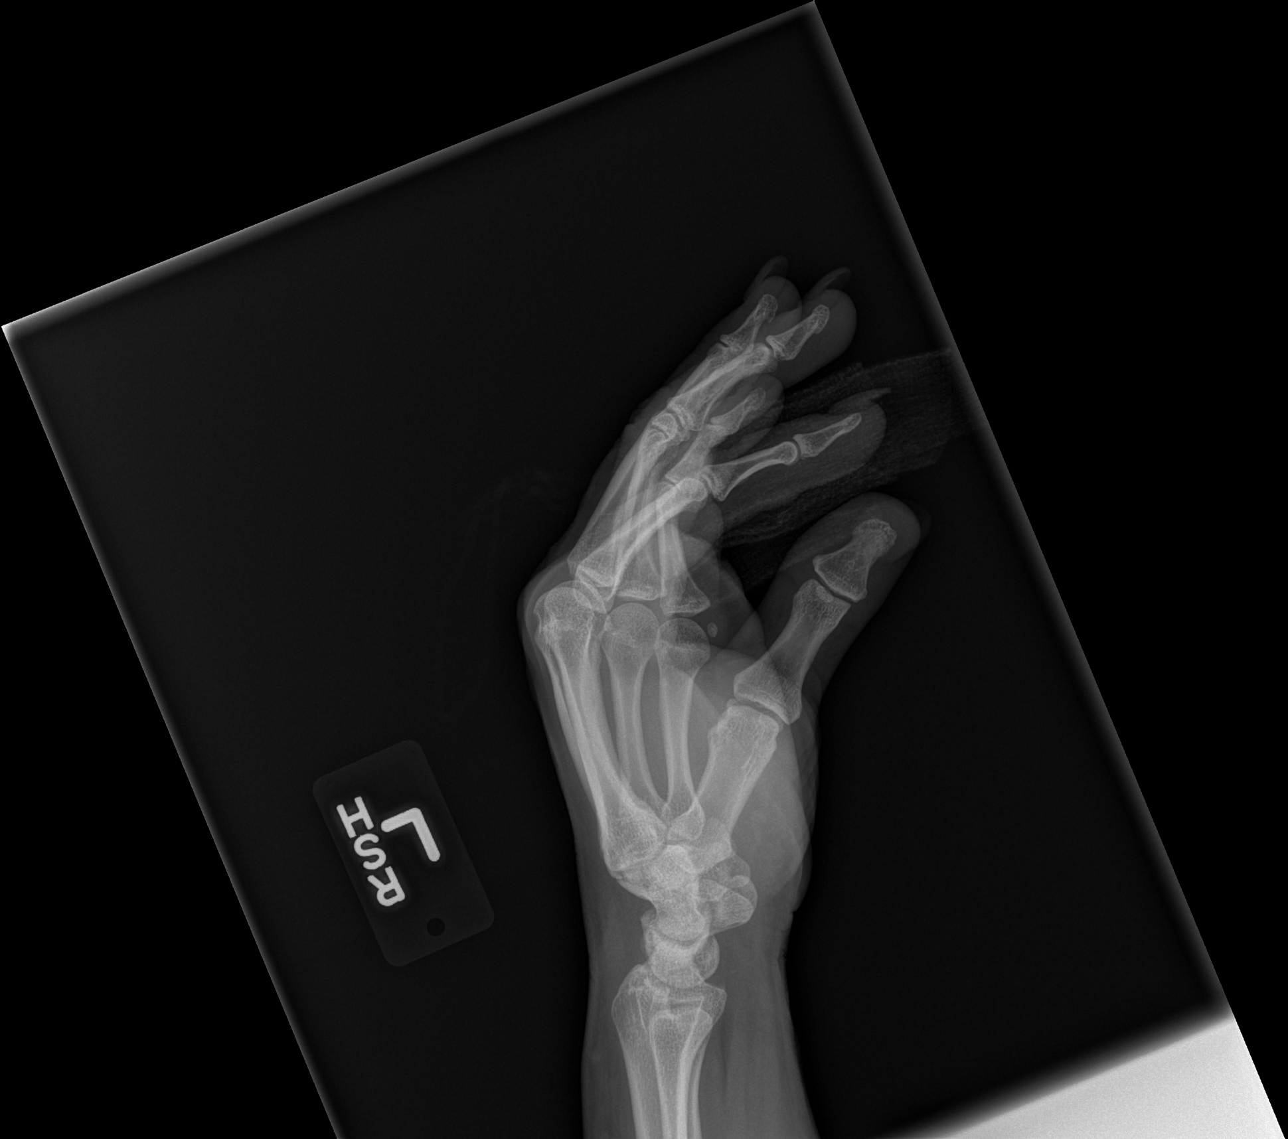

[3 of 3 positions shown; findings below may reference images not displayed]

FINDINGS: Soft tissue and injury left index finger. Soft tissue bandage is
present. No acute bony abnormality identified. No radiopaque foreign
body .
IMPRESSION: No acute bony abnormality.  No radiopaque foreign body.

## 2019-02-20 ENCOUNTER — Encounter: Payer: Self-pay | Admitting: Gynecology

## 2019-02-28 ENCOUNTER — Encounter: Payer: Self-pay | Admitting: Family Medicine

## 2019-02-28 MED ORDER — ADDERALL XR 20 MG PO CP24: 20.0000 mg | ORAL_CAPSULE | Freq: Every day | ORAL | 0 refills | Status: DC

## 2019-02-28 NOTE — Telephone Encounter (Signed)
Ok to refill??  Last office visit 08/31/2018.  Last refill 01/19/2019.

## 2019-02-28 NOTE — Telephone Encounter (Signed)
Needs OV - will give refill for this month, schedule appt

## 2019-03-30 ENCOUNTER — Encounter: Payer: Self-pay | Admitting: Family Medicine

## 2019-03-30 ENCOUNTER — Ambulatory Visit (INDEPENDENT_AMBULATORY_CARE_PROVIDER_SITE_OTHER): Payer: 59 | Admitting: Family Medicine

## 2019-03-30 ENCOUNTER — Other Ambulatory Visit: Payer: Self-pay

## 2019-03-30 VITALS — BP 128/64 | HR 100 | Temp 97.8°F | Resp 16 | Ht 63.0 in | Wt 214.0 lb

## 2019-03-30 DIAGNOSIS — E282 Polycystic ovarian syndrome: Secondary | ICD-10-CM

## 2019-03-30 DIAGNOSIS — F902 Attention-deficit hyperactivity disorder, combined type: Secondary | ICD-10-CM

## 2019-03-30 DIAGNOSIS — R7303 Prediabetes: Secondary | ICD-10-CM | POA: Diagnosis not present

## 2019-03-30 DIAGNOSIS — Z23 Encounter for immunization: Secondary | ICD-10-CM | POA: Diagnosis not present

## 2019-03-30 DIAGNOSIS — E669 Obesity, unspecified: Secondary | ICD-10-CM

## 2019-03-30 MED ORDER — METFORMIN HCL 500 MG PO TABS: 500.0000 mg | ORAL_TABLET | Freq: Every day | ORAL | 3 refills | Status: DC

## 2019-03-30 MED ORDER — ADDERALL XR 20 MG PO CP24: 20.0000 mg | ORAL_CAPSULE | Freq: Every day | ORAL | 0 refills | Status: DC

## 2019-03-30 NOTE — Assessment & Plan Note (Signed)
PCOS with obesity and borderline diabetes mellitus.  Continue Metformin we will check her A1c as well as her lipid panel metabolic panel.  We discussed dietary changes and need for weight loss.  This will also help with her chronic back pain.

## 2019-03-30 NOTE — Patient Instructions (Addendum)
F/U 6 months  Schedule with your GYN

## 2019-03-30 NOTE — Progress Notes (Signed)
   Subjective:    Patient ID: Rachel French, female    DOB: 1975-12-04, 43 y.o.   MRN: [id number]  Patient presents for Follow-up (is fasting) Patient here to follow-up chronic medical problems and medications. She is currently on metformin 500 mg once a day for prediabetes and she is due for repeat labs and lipid panel he also has underlying PCOS  - weight up 10lbs   ADD  she is on Adderall 20 mg dailywithout any difficulty but she prefers to Vyvanse   She has not seen Dr. Louanne Skye recently, not on gabapentin    Review Of Systems:  GEN- denies fatigue, fever, weight loss,weakness, recent illness HEENT- denies eye drainage, change in vision, nasal discharge, CVS- denies chest pain, palpitations RESP- denies SOB, cough, wheeze ABD- denies N/V, change in stools, abd pain GU- denies dysuria, hematuria, dribbling, incontinence MSK- denies joint pain, muscle aches, injury Neuro- denies headache, dizziness, syncope, seizure activity       Objective:    BP 128/64   Pulse 100   Temp 97.8 F (36.6 C) (Temporal)   Resp 16   Ht 5\' 3"  (1.6 m)   Wt 214 lb (97.1 kg)   SpO2 97%   BMI 37.91 kg/m  GEN- NAD, alert and oriented x3 HEENT- PERRL, EOMI, non injected sclera, pink conjunctiva, MMM, oropharynx clear Neck- Supple, no thyromegaly CVS- RRR, no murmur RESP-CTAB ABD-NABS,soft,NT,ND Psych- normal affect and mood  EXT- No edema Pulses- Radial  2+        Assessment & Plan:      Problem List Items Addressed This Visit      Unprioritized   ADD (attention deficit disorder) - Primary    Continue Adderall change to dose.      Obesity (BMI 30-39.9)   Relevant Orders   Lipid panel   Hemoglobin A1c   PCOS (polycystic ovarian syndrome)    PCOS with obesity and borderline diabetes mellitus.  Continue Metformin we will check her A1c as well as her lipid panel metabolic panel.  We discussed dietary changes and need for weight loss.  This will also help with her chronic back pain.      Relevant Orders   CBC with Differential/Platelet   Comprehensive metabolic panel   Pre-diabetes   Relevant Orders   Lipid panel   Hemoglobin A1c    Other Visit Diagnoses    Need for immunization against influenza       Relevant Orders   Flu Vaccine QUAD 36+ mos IM (Completed)      Note: This dictation was prepared with Dragon dictation along with smaller phrase technology. Any transcriptional errors that result from this process are unintentional.

## 2019-03-30 NOTE — Assessment & Plan Note (Signed)
Continue Adderall change to dose.

## 2019-03-31 LAB — CBC WITH DIFFERENTIAL/PLATELET
Absolute Monocytes: 1445 cells/uL — ABNORMAL HIGH (ref 200–950)
Basophils Absolute: 77 cells/uL (ref 0–200)
Basophils Relative: 0.6 %
Eosinophils Absolute: 490 cells/uL (ref 15–500)
Eosinophils Relative: 3.8 %
HCT: 39.2 % (ref 35.0–45.0)
Hemoglobin: 13.4 g/dL (ref 11.7–15.5)
Lymphs Abs: 4592 cells/uL — ABNORMAL HIGH (ref 850–3900)
MCH: 31.8 pg (ref 27.0–33.0)
MCHC: 34.2 g/dL (ref 32.0–36.0)
MCV: 93.1 fL (ref 80.0–100.0)
MPV: 11.8 fL (ref 7.5–12.5)
Monocytes Relative: 11.2 %
Neutro Abs: 6295 cells/uL (ref 1500–7800)
Neutrophils Relative %: 48.8 %
Platelets: 388 10*3/uL (ref 140–400)
RBC: 4.21 10*6/uL (ref 3.80–5.10)
RDW: 13.2 % (ref 11.0–15.0)
Total Lymphocyte: 35.6 %
WBC: 12.9 10*3/uL — ABNORMAL HIGH (ref 3.8–10.8)

## 2019-03-31 LAB — COMPREHENSIVE METABOLIC PANEL
AG Ratio: 1.6 (calc) (ref 1.0–2.5)
ALT: 12 U/L (ref 6–29)
AST: 14 U/L (ref 10–30)
Albumin: 4.4 g/dL (ref 3.6–5.1)
Alkaline phosphatase (APISO): 76 U/L (ref 31–125)
BUN: 12 mg/dL (ref 7–25)
CO2: 24 mmol/L (ref 20–32)
Calcium: 9.6 mg/dL (ref 8.6–10.2)
Chloride: 105 mmol/L (ref 98–110)
Creat: 0.67 mg/dL (ref 0.50–1.10)
Globulin: 2.8 g/dL (calc) (ref 1.9–3.7)
Glucose, Bld: 129 mg/dL — ABNORMAL HIGH (ref 65–99)
Potassium: 4.7 mmol/L (ref 3.5–5.3)
Sodium: 138 mmol/L (ref 135–146)
Total Bilirubin: 0.4 mg/dL (ref 0.2–1.2)
Total Protein: 7.2 g/dL (ref 6.1–8.1)

## 2019-03-31 LAB — LIPID PANEL
Cholesterol: 156 mg/dL (ref <200)
HDL: 42 mg/dL — ABNORMAL LOW (ref >=50)
LDL Cholesterol (Calc): 98 mg/dL (calc)
Non-HDL Cholesterol (Calc): 114 mg/dL (calc) (ref <130)
Total CHOL/HDL Ratio: 3.7 (calc) (ref <5.0)
Triglycerides: 73 mg/dL (ref <150)

## 2019-03-31 LAB — HEMOGLOBIN A1C
Hgb A1c MFr Bld: 6.1 % of total Hgb — ABNORMAL HIGH (ref <5.7)
Mean Plasma Glucose: 128 (calc)
eAG (mmol/L): 7.1 (calc)

## 2019-05-02 ENCOUNTER — Other Ambulatory Visit: Payer: Self-pay | Admitting: Family Medicine

## 2019-05-03 NOTE — Telephone Encounter (Signed)
Ok to refill??  Last office visit/ refill 03/30/2019.

## 2019-05-04 MED ORDER — ADDERALL XR 20 MG PO CP24: 20.0000 mg | ORAL_CAPSULE | Freq: Every day | ORAL | 0 refills | Status: DC

## 2019-05-08 ENCOUNTER — Other Ambulatory Visit: Payer: Self-pay

## 2019-05-09 ENCOUNTER — Encounter: Payer: Self-pay | Admitting: Gynecology

## 2019-05-09 ENCOUNTER — Ambulatory Visit (INDEPENDENT_AMBULATORY_CARE_PROVIDER_SITE_OTHER): Payer: 59 | Admitting: Gynecology

## 2019-05-09 VITALS — BP 116/76 | Ht 62.0 in | Wt 211.0 lb

## 2019-05-09 DIAGNOSIS — Z01419 Encounter for gynecological examination (general) (routine) without abnormal findings: Secondary | ICD-10-CM

## 2019-05-09 DIAGNOSIS — Z1151 Encounter for screening for human papillomavirus (HPV): Secondary | ICD-10-CM | POA: Diagnosis not present

## 2019-05-09 DIAGNOSIS — Z30431 Encounter for routine checking of intrauterine contraceptive device: Secondary | ICD-10-CM

## 2019-05-09 NOTE — Progress Notes (Signed)
    Rachel French [birth year] [id number]        43 y.o.  G2P0011 for annual gynecologic exam.  Without gynecologic complaints  Past medical history,surgical history, problem list, medications, allergies, family history and social history were all reviewed and documented as reviewed in the EPIC chart.  ROS:  Performed with pertinent positives and negatives included in the history, assessment and plan.   Additional significant findings : None   Exam: Caryn Bee assistant Vitals:   05/09/19 1104  BP: 116/76  Weight: 211 lb (95.7 kg)  Height: 5\' 2"  (1.575 m)   Body mass index is 38.59 kg/m.  General appearance:  Normal affect, orientation and appearance. Skin: Grossly normal HEENT: Without gross lesions.  No cervical or supraclavicular adenopathy. Thyroid normal.  Lungs:  Clear without wheezing, rales or rhonchi Cardiac: RR, without RMG Abdominal:  Soft, nontender, without masses, guarding, rebound, organomegaly or hernia Breasts:  Examined lying and sitting without masses, retractions, discharge or axillary adenopathy. Pelvic:  Ext, BUS, Vagina: Normal  Cervix: Normal.  IUD string palpated at external os  Uterus: Retroverted, normal size, shape and contour, midline and mobile nontender   Adnexa: Without masses or tenderness    Anus and perineum: Normal   Rectovaginal: Normal sphincter tone without palpated masses or tenderness.    Assessment/Plan:  43 y.o. G15P0011 female for annual gynecologic exam.  Without menses, Mirena IUD  1. Mirena IUD 10/2016.  Doing well without menses. 2. Mammography 2017.  Recommended follow-up baseline mammogram now she agrees to arrange.  Breast exam normal today. 3. Pap smear 2017.  Pap smear/HPV today.  History of LGSIL with positive high risk HPV in the past.  Negative Pap smear/HPV 2015.  Negative regular Pap smears in 2016 and 17. 4. Health maintenance.  No routine lab work done as patient reports is done elsewhere.  Follow-up 1 year, sooner as  needed.   Anastasio Auerbach MD, 11:31 AM 05/09/2019

## 2019-05-09 NOTE — Patient Instructions (Signed)
Follow-up in 1 year for annual exam, sooner as needed. 

## 2019-05-09 NOTE — Addendum Note (Signed)
Addended by: Nelva Nay on: 05/09/2019 12:20 PM   Modules accepted: Orders

## 2019-05-11 LAB — PAP IG AND HPV HIGH-RISK: HPV DNA High Risk: NOT DETECTED

## 2019-06-11 ENCOUNTER — Other Ambulatory Visit: Payer: Self-pay | Admitting: Family Medicine

## 2019-06-11 MED ORDER — ADDERALL XR 20 MG PO CP24: 20.0000 mg | ORAL_CAPSULE | Freq: Every day | ORAL | 0 refills | Status: DC

## 2019-06-11 NOTE — Telephone Encounter (Signed)
Ok to refill??  Last office visit 03/30/2019.   Last refill 05/04/2019.

## 2019-07-16 ENCOUNTER — Other Ambulatory Visit: Payer: Self-pay | Admitting: Family Medicine

## 2019-07-16 MED ORDER — ADDERALL XR 20 MG PO CP24: 20.0000 mg | ORAL_CAPSULE | Freq: Every day | ORAL | 0 refills | Status: DC

## 2019-07-16 NOTE — Telephone Encounter (Signed)
Ok to refill??  Last office visit 03/30/2019.  Last refill 06/11/2019.

## 2019-08-15 ENCOUNTER — Other Ambulatory Visit: Payer: Self-pay | Admitting: Family Medicine

## 2019-08-15 MED ORDER — ADDERALL XR 20 MG PO CP24: 20.0000 mg | ORAL_CAPSULE | Freq: Every day | ORAL | 0 refills | Status: DC

## 2019-08-15 NOTE — Telephone Encounter (Signed)
Ok to refill??  Last office visit 03/30/2019.  Last refill 07/16/2019.

## 2019-09-13 ENCOUNTER — Other Ambulatory Visit: Payer: Self-pay | Admitting: Family Medicine

## 2019-09-14 MED ORDER — ADDERALL XR 20 MG PO CP24: 20.0000 mg | ORAL_CAPSULE | Freq: Every day | ORAL | 0 refills | Status: DC

## 2019-09-14 NOTE — Telephone Encounter (Signed)
Ok to refill??  Last office visit 03/30/2019.  Last refill 08/15/2019.

## 2019-10-15 ENCOUNTER — Other Ambulatory Visit: Payer: Self-pay | Admitting: Family Medicine

## 2019-10-16 MED ORDER — ADDERALL XR 20 MG PO CP24: 20.0000 mg | ORAL_CAPSULE | Freq: Every day | ORAL | 0 refills | Status: DC

## 2019-10-16 NOTE — Telephone Encounter (Signed)
Ok to refill??  Last office visit 03/30/2019.  Last refill 09/14/2019.

## 2019-11-14 ENCOUNTER — Other Ambulatory Visit: Payer: Self-pay | Admitting: Family Medicine

## 2019-11-14 MED ORDER — ADDERALL XR 20 MG PO CP24: 20.0000 mg | ORAL_CAPSULE | Freq: Every day | ORAL | 0 refills | Status: DC

## 2019-11-14 NOTE — Telephone Encounter (Signed)
Pt need OV before any further refills

## 2019-11-14 NOTE — Telephone Encounter (Signed)
Ok to refill??  Last office visit 03/30/2019.  Last refill 10/16/2019.

## 2019-11-16 ENCOUNTER — Encounter: Payer: Self-pay | Admitting: Family Medicine

## 2019-11-16 ENCOUNTER — Ambulatory Visit (INDEPENDENT_AMBULATORY_CARE_PROVIDER_SITE_OTHER): Payer: 59 | Admitting: Family Medicine

## 2019-11-16 ENCOUNTER — Other Ambulatory Visit: Payer: Self-pay

## 2019-11-16 VITALS — BP 124/68 | HR 82 | Temp 98.1°F | Resp 16 | Ht 62.0 in | Wt 196.0 lb

## 2019-11-16 DIAGNOSIS — F902 Attention-deficit hyperactivity disorder, combined type: Secondary | ICD-10-CM

## 2019-11-16 DIAGNOSIS — L237 Allergic contact dermatitis due to plants, except food: Secondary | ICD-10-CM | POA: Diagnosis not present

## 2019-11-16 DIAGNOSIS — R7303 Prediabetes: Secondary | ICD-10-CM | POA: Diagnosis not present

## 2019-11-16 MED ORDER — METHYLPREDNISOLONE ACETATE 80 MG/ML IJ SUSP
80.0000 mg | Freq: Once | INTRAMUSCULAR | Status: AC
  Administered 2019-11-16: 80 mg via INTRAMUSCULAR

## 2019-11-16 MED ORDER — PREDNISONE 10 MG PO TABS
ORAL_TABLET | ORAL | 0 refills | Status: DC
Start: 2019-11-16 — End: 2020-02-29

## 2019-11-16 NOTE — Assessment & Plan Note (Signed)
In setting of PCOS Weight loss noted, continue lower carb diet Continue metformin Labs at CPE

## 2019-11-16 NOTE — Assessment & Plan Note (Signed)
Continue Adderral  Pt to schedule CPE will have fasting labs done

## 2019-11-16 NOTE — Progress Notes (Signed)
   Subjective:    Patient ID: Rachel French, female    DOB: March 13, 1976, 44 y.o.   MRN: [id number]  Patient presents for Rash (x1 week- irritation to arms and legs- cleaaning in yard ) and Medication Management (f/u meds ) Patient here with poison ivy rash for the past week.  It was in her backyard.  She typically has reactions to poison ivy.  She has been trying to use triamcinolone cream but has lesions on her legs and arms.  She did have one spot on her face but she was able to get that cleared up.  She still has intense itching especially at nighttime.  Benadryl also has not helped.  ADHD has been taking Adderall 20 mg once a day without any difficulty she continues to require this medication to help with her concentration.  She has not had any side effects.  She is intentionally reduced her carbohydrates initially on the Nutrisystem and has lost 15 pounds in the past 4 months  She is still takingmetformin for her PCOS  Review Of Systems:  GEN- denies fatigue, fever, weight loss,weakness, recent illness HEENT- denies eye drainage, change in vision, nasal discharge, CVS- denies chest pain, palpitations RESP- denies SOB, cough, wheeze ABD- denies N/V, change in stools, abd pain GU- denies dysuria, hematuria, dribbling, incontinence MSK- denies joint pain, muscle aches, injury Neuro- denies headache, dizziness, syncope, seizure activity       Objective:    BP 124/68   Pulse 82   Temp 98.1 F (36.7 C) (Temporal)   Resp 16   Ht 5\' 2"  (1.575 m)   Wt 196 lb (88.9 kg)   SpO2 98%   BMI 35.85 kg/m  GEN- NAD, alert and oriented x3 HEENT- PERRL, EOMI, non injected sclera, pink conjunctiva, MMM, oropharynx clear Neck- Supple, no LAD CVS- RRR, no murmur RESP-CTAB Psych- normal affect and mood Skin-erythematous fine maculopapular rash scattered on her legs and arms/hands, most in linear fashion  No lesions on the face or neck.  Positive excoriations on both upper and lower  extremities EXT- No edema Pulses- Radial  2+        Assessment & Plan:      Problem List Items Addressed This Visit      Unprioritized   ADD (attention deficit disorder)    Continue Adderral  Pt to schedule CPE will have fasting labs done      Pre-diabetes    In setting of PCOS Weight loss noted, continue lower carb diet Continue metformin Labs at CPE       Other Visit Diagnoses    Poison ivy dermatitis    -  Primary   Treat with dEPO MEDROL 80mg  IM, followed by prednisone taper    Relevant Medications   methylPREDNISolone acetate (DEPO-MEDROL) injection 80 mg (Completed) (Start on 11/16/2019  1:15 PM)      Note: This dictation was prepared with Dragon dictation along with smaller phrase technology. Any transcriptional errors that result from this process are unintentional.

## 2019-11-16 NOTE — Patient Instructions (Signed)
F/U 3 months for Physical  

## 2019-11-20 ENCOUNTER — Telehealth: Payer: Self-pay | Admitting: *Deleted

## 2019-11-20 NOTE — Telephone Encounter (Signed)
Received VM from patient.   States that she thought a cream was to be prescribed for her dermatitis as well as prednisone.   MD please advise.

## 2019-11-20 NOTE — Telephone Encounter (Signed)
No cream She was given a shot and prednisone taper

## 2019-12-17 ENCOUNTER — Other Ambulatory Visit: Payer: Self-pay | Admitting: Family Medicine

## 2019-12-18 MED ORDER — ADDERALL XR 20 MG PO CP24: 20.0000 mg | ORAL_CAPSULE | Freq: Every day | ORAL | 0 refills | Status: DC

## 2019-12-18 NOTE — Telephone Encounter (Signed)
Requested Prescriptions   Pending Prescriptions Disp Refills  . ADDERALL XR 20 MG 24 hr capsule 30 capsule 0    Sig: Take 1 capsule (20 mg total) by mouth daily.     Last OV 11/16/2019   Last written 11/14/2019

## 2020-01-15 ENCOUNTER — Other Ambulatory Visit: Payer: Self-pay | Admitting: Family Medicine

## 2020-01-16 MED ORDER — ADDERALL XR 20 MG PO CP24: 20.0000 mg | ORAL_CAPSULE | Freq: Every day | ORAL | 0 refills | Status: DC

## 2020-01-16 NOTE — Telephone Encounter (Signed)
Requested Prescriptions   Pending Prescriptions Disp Refills  . ADDERALL XR 20 MG 24 hr capsule 30 capsule 0    Sig: Take 1 capsule (20 mg total) by mouth daily.     Last OV 11/16/2019   Last written 12/18/2019

## 2020-02-18 ENCOUNTER — Other Ambulatory Visit: Payer: Self-pay | Admitting: Family Medicine

## 2020-02-18 MED ORDER — ADDERALL XR 20 MG PO CP24: 20.0000 mg | ORAL_CAPSULE | Freq: Every day | ORAL | 0 refills | Status: DC

## 2020-02-18 NOTE — Telephone Encounter (Signed)
Requested Prescriptions   Pending Prescriptions Disp Refills  . ADDERALL XR 20 MG 24 hr capsule 30 capsule 0    Sig: Take 1 capsule (20 mg total) by mouth daily.     Last OV 11/16/2019   Last written 01/16/2020

## 2020-02-29 ENCOUNTER — Ambulatory Visit (INDEPENDENT_AMBULATORY_CARE_PROVIDER_SITE_OTHER): Payer: 59 | Admitting: Family Medicine

## 2020-02-29 ENCOUNTER — Encounter: Payer: Self-pay | Admitting: Family Medicine

## 2020-02-29 ENCOUNTER — Other Ambulatory Visit: Payer: Self-pay

## 2020-02-29 VITALS — BP 138/72 | HR 84 | Temp 98.3°F | Resp 16 | Ht 62.0 in | Wt 190.0 lb

## 2020-02-29 DIAGNOSIS — Z1159 Encounter for screening for other viral diseases: Secondary | ICD-10-CM

## 2020-02-29 DIAGNOSIS — F902 Attention-deficit hyperactivity disorder, combined type: Secondary | ICD-10-CM | POA: Diagnosis not present

## 2020-02-29 DIAGNOSIS — Z0001 Encounter for general adult medical examination with abnormal findings: Secondary | ICD-10-CM | POA: Diagnosis not present

## 2020-02-29 DIAGNOSIS — E669 Obesity, unspecified: Secondary | ICD-10-CM

## 2020-02-29 DIAGNOSIS — Z1231 Encounter for screening mammogram for malignant neoplasm of breast: Secondary | ICD-10-CM

## 2020-02-29 DIAGNOSIS — Z0000 Encounter for general adult medical examination without abnormal findings: Secondary | ICD-10-CM

## 2020-02-29 DIAGNOSIS — R7303 Prediabetes: Secondary | ICD-10-CM

## 2020-02-29 DIAGNOSIS — E282 Polycystic ovarian syndrome: Secondary | ICD-10-CM

## 2020-02-29 DIAGNOSIS — Z23 Encounter for immunization: Secondary | ICD-10-CM

## 2020-02-29 MED ORDER — ADDERALL XR 20 MG PO CP24
20.0000 mg | ORAL_CAPSULE | Freq: Every day | ORAL | 0 refills | Status: DC
Start: 2020-02-29 — End: 2020-03-17

## 2020-02-29 NOTE — Patient Instructions (Addendum)
F/U  6 months   We will call with lab results Schedule your mammogram  Schedule dental visit

## 2020-02-29 NOTE — Assessment & Plan Note (Signed)
Continue Adderall 

## 2020-02-29 NOTE — Progress Notes (Signed)
   Subjective:    Patient ID: Rachel French, female    DOB: 1976/04/27, 44 y.o.   MRN: [id number]  Patient presents for Annual Exam (is fasting)  Pt here for CPE, medications and history reviewed  Pre-diabetes with PCOS- she is on metformin ,weight down another 6lbs, she is tracking food and trying to reduce carbs, weight down 21lbs from last December   ADHD- taking Adderall 20mg  once a day, no concerns, meds help her stay on track   PAP Smear is UTD  Immunizations- discussed COVID-19 vaccine, Due for flu vaccine  Due for Hep C screening   She has a new grandson!    Review Of Systems:  GEN- denies fatigue, fever, weight loss,weakness, recent illness HEENT- denies eye drainage, change in vision, nasal discharge, CVS- denies chest pain, palpitations RESP- denies SOB, cough, wheeze ABD- denies N/V, change in stools, abd pain GU- denies dysuria, hematuria, dribbling, incontinence MSK- denies joint pain, muscle aches, injury Neuro- denies headache, dizziness, syncope, seizure activity       Objective:    BP 138/72   Pulse 84   Temp 98.3 F (36.8 C) (Temporal)   Resp 16   Ht 5\' 2"  (1.575 m)   Wt 190 lb (86.2 kg)   SpO2 98%   BMI 34.75 kg/m  GEN- NAD, alert and oriented x3 HEENT- PERRL, EOMI, non injected sclera, pink conjunctiva,, TM clear no effusion  Neck- Supple, no thyromegaly CVS- RRR, no murmur RESP-CTAB ABD-NABS,soft,NT,ND Psych normal affect and mood  EXT- No edema Pulses- Radial, DP- 2+  FALL/DEPRESSION/AUDIT C screen neg       Assessment & Plan:      Problem List Items Addressed This Visit      Unprioritized   ADD (attention deficit disorder)    Continue Adderall       Obesity (BMI 30-39.9)   Relevant Medications   ADDERALL XR 20 MG 24 hr capsule   Other Relevant Orders   Lipid panel   PCOS (polycystic ovarian syndrome)    Recheck A1C, doing well with weight loss Continue metformin, low carb diet       Pre-diabetes   Relevant Orders    Hemoglobin A1c    Other Visit Diagnoses    Routine general medical examination at a health care facility    -  Primary   CPE done, fasting labs obtained, Flu shot given, counsled on covid-19 vaccine    Relevant Orders   CBC with Differential/Platelet   Comprehensive metabolic panel   Lipid panel   Encounter for screening mammogram for malignant neoplasm of breast       Relevant Orders   MM 3D SCREEN BREAST BILATERAL   Need for hepatitis C screening test       Relevant Orders   Hepatitis C antibody      Note: This dictation was prepared with Dragon dictation along with smaller phrase technology. Any transcriptional errors that result from this process are unintentional.

## 2020-02-29 NOTE — Assessment & Plan Note (Signed)
Recheck A1C, doing well with weight loss Continue metformin, low carb diet

## 2020-03-03 LAB — LIPID PANEL
Cholesterol: 166 mg/dL (ref <200)
HDL: 49 mg/dL — ABNORMAL LOW (ref >=50)
LDL Cholesterol (Calc): 100 mg/dL (calc) — ABNORMAL HIGH
Non-HDL Cholesterol (Calc): 117 mg/dL (calc) (ref <130)
Total CHOL/HDL Ratio: 3.4 (calc) (ref <5.0)
Triglycerides: 83 mg/dL (ref <150)

## 2020-03-03 LAB — COMPREHENSIVE METABOLIC PANEL
AG Ratio: 1.7 (calc) (ref 1.0–2.5)
ALT: 15 U/L (ref 6–29)
AST: 13 U/L (ref 10–30)
Albumin: 4.5 g/dL (ref 3.6–5.1)
Alkaline phosphatase (APISO): 75 U/L (ref 31–125)
BUN: 15 mg/dL (ref 7–25)
CO2: 23 mmol/L (ref 20–32)
Calcium: 9.7 mg/dL (ref 8.6–10.2)
Chloride: 103 mmol/L (ref 98–110)
Creat: 0.73 mg/dL (ref 0.50–1.10)
Globulin: 2.6 g/dL (calc) (ref 1.9–3.7)
Glucose, Bld: 110 mg/dL — ABNORMAL HIGH (ref 65–99)
Potassium: 4.5 mmol/L (ref 3.5–5.3)
Sodium: 137 mmol/L (ref 135–146)
Total Bilirubin: 0.4 mg/dL (ref 0.2–1.2)
Total Protein: 7.1 g/dL (ref 6.1–8.1)

## 2020-03-03 LAB — CBC WITH DIFFERENTIAL/PLATELET
Absolute Monocytes: 1217 cells/uL — ABNORMAL HIGH (ref 200–950)
Basophils Absolute: 104 cells/uL (ref 0–200)
Basophils Relative: 1 %
Eosinophils Absolute: 478 cells/uL (ref 15–500)
Eosinophils Relative: 4.6 %
HCT: 42.6 % (ref 35.0–45.0)
Hemoglobin: 14.4 g/dL (ref 11.7–15.5)
Lymphs Abs: 3962 cells/uL — ABNORMAL HIGH (ref 850–3900)
MCH: 32.7 pg (ref 27.0–33.0)
MCHC: 33.8 g/dL (ref 32.0–36.0)
MCV: 96.8 fL (ref 80.0–100.0)
MPV: 11.8 fL (ref 7.5–12.5)
Monocytes Relative: 11.7 %
Neutro Abs: 4638 cells/uL (ref 1500–7800)
Neutrophils Relative %: 44.6 %
Platelets: 378 10*3/uL (ref 140–400)
RBC: 4.4 10*6/uL (ref 3.80–5.10)
RDW: 13.1 % (ref 11.0–15.0)
Total Lymphocyte: 38.1 %
WBC: 10.4 10*3/uL (ref 3.8–10.8)

## 2020-03-03 LAB — HEMOGLOBIN A1C
Hgb A1c MFr Bld: 5.9 % of total Hgb — ABNORMAL HIGH (ref <5.7)
Mean Plasma Glucose: 123 (calc)
eAG (mmol/L): 6.8 (calc)

## 2020-03-03 LAB — HEPATITIS C ANTIBODY
Hepatitis C Ab: NONREACTIVE
SIGNAL TO CUT-OFF: 0.01 (ref <1.00)

## 2020-03-05 ENCOUNTER — Encounter: Payer: Self-pay | Admitting: *Deleted

## 2020-03-17 ENCOUNTER — Other Ambulatory Visit: Payer: Self-pay | Admitting: Family Medicine

## 2020-03-17 MED ORDER — ADDERALL XR 20 MG PO CP24
20.0000 mg | ORAL_CAPSULE | Freq: Every day | ORAL | 0 refills | Status: DC
Start: 2020-03-17 — End: 2020-04-16

## 2020-03-17 NOTE — Telephone Encounter (Signed)
Ok to refill??  Last office visit/ refill 02/29/2020.

## 2020-04-16 ENCOUNTER — Other Ambulatory Visit: Payer: Self-pay | Admitting: Family Medicine

## 2020-04-16 MED ORDER — ADDERALL XR 20 MG PO CP24
20.0000 mg | ORAL_CAPSULE | Freq: Every day | ORAL | 0 refills | Status: DC
Start: 2020-04-16 — End: 2020-04-17

## 2020-04-16 MED ORDER — ADDERALL XR 20 MG PO CP24
20.0000 mg | ORAL_CAPSULE | Freq: Every day | ORAL | 0 refills | Status: DC
Start: 2020-04-16 — End: 2020-04-16

## 2020-04-16 NOTE — Addendum Note (Signed)
Addended by: Phillips Odor on: 04/16/2020 02:04 PM   Modules accepted: Orders

## 2020-04-16 NOTE — Addendum Note (Signed)
Addended by: Phillips Odor on: 04/16/2020 04:55 PM   Modules accepted: Orders

## 2020-04-16 NOTE — Telephone Encounter (Signed)
Please re-submit as transmission to pharmacy failed again.

## 2020-04-16 NOTE — Telephone Encounter (Signed)
Last refill: 03/17/20

## 2020-04-16 NOTE — Telephone Encounter (Signed)
Please re-submit as transmission to pharmacy failed.  

## 2020-04-16 NOTE — Telephone Encounter (Signed)
Please print prescription as medication has failed multiple times.

## 2020-04-16 NOTE — Addendum Note (Signed)
Addended by: Phillips Odor on: 04/16/2020 03:21 PM   Modules accepted: Orders

## 2020-04-17 MED ORDER — ADDERALL XR 20 MG PO CP24
20.0000 mg | ORAL_CAPSULE | Freq: Every day | ORAL | 0 refills | Status: DC
Start: 2020-04-17 — End: 2020-05-16

## 2020-04-17 NOTE — Telephone Encounter (Signed)
Please Advise

## 2020-04-17 NOTE — Telephone Encounter (Signed)
Med refilled.

## 2020-04-17 NOTE — Addendum Note (Signed)
Addended by: Milinda Antis F on: 04/17/2020 07:09 PM   Modules accepted: Orders

## 2020-05-16 ENCOUNTER — Other Ambulatory Visit: Payer: Self-pay | Admitting: Family Medicine

## 2020-05-16 MED ORDER — ADDERALL XR 20 MG PO CP24
20.0000 mg | ORAL_CAPSULE | Freq: Every day | ORAL | 0 refills | Status: DC
Start: 2020-05-16 — End: 2020-06-18

## 2020-05-16 NOTE — Telephone Encounter (Signed)
Ok to refill??  Last office visit 02/29/2020.  Last refill 04/17/2020.

## 2020-06-18 ENCOUNTER — Other Ambulatory Visit: Payer: Self-pay | Admitting: Family Medicine

## 2020-06-18 MED ORDER — ADDERALL XR 20 MG PO CP24
20.0000 mg | ORAL_CAPSULE | Freq: Every day | ORAL | 0 refills | Status: DC
Start: 2020-06-18 — End: 2020-07-17

## 2020-06-18 NOTE — Telephone Encounter (Signed)
Ok to refill??  Last office visit 02/29/2020.  Last refill 05/16/2020.

## 2020-06-19 ENCOUNTER — Other Ambulatory Visit: Payer: Self-pay

## 2020-06-19 ENCOUNTER — Ambulatory Visit (INDEPENDENT_AMBULATORY_CARE_PROVIDER_SITE_OTHER): Payer: 59 | Admitting: Nurse Practitioner

## 2020-06-19 ENCOUNTER — Encounter: Payer: Self-pay | Admitting: Nurse Practitioner

## 2020-06-19 VITALS — BP 132/88 | HR 110 | Resp 14 | Ht 62.0 in | Wt 192.5 lb

## 2020-06-19 DIAGNOSIS — Z30431 Encounter for routine checking of intrauterine contraceptive device: Secondary | ICD-10-CM

## 2020-06-19 DIAGNOSIS — E282 Polycystic ovarian syndrome: Secondary | ICD-10-CM | POA: Diagnosis not present

## 2020-06-19 DIAGNOSIS — Z01419 Encounter for gynecological examination (general) (routine) without abnormal findings: Secondary | ICD-10-CM | POA: Diagnosis not present

## 2020-06-19 DIAGNOSIS — L680 Hirsutism: Secondary | ICD-10-CM | POA: Diagnosis not present

## 2020-06-19 MED ORDER — SPIRONOLACTONE 50 MG PO TABS: 50.0000 mg | ORAL_TABLET | Freq: Every day | ORAL | 2 refills | Status: DC

## 2020-06-19 NOTE — Patient Instructions (Signed)
Health Maintenance, Female Adopting a healthy lifestyle and getting preventive care are important in promoting health and wellness. Ask your health care provider about:  The right schedule for you to have regular tests and exams.  Things you can do on your own to prevent diseases and keep yourself healthy. What should I know about diet, weight, and exercise? Eat a healthy diet  Eat a diet that includes plenty of vegetables, fruits, low-fat dairy products, and lean protein.  Do not eat a lot of foods that are high in solid fats, added sugars, or sodium.   Maintain a healthy weight Body mass index (BMI) is used to identify weight problems. It estimates body fat based on height and weight. Your health care provider can help determine your BMI and help you achieve or maintain a healthy weight. Get regular exercise Get regular exercise. This is one of the most important things you can do for your health. Most adults should:  Exercise for at least 150 minutes each week. The exercise should increase your heart rate and make you sweat (moderate-intensity exercise).  Do strengthening exercises at least twice a week. This is in addition to the moderate-intensity exercise.  Spend less time sitting. Even light physical activity can be beneficial. Watch cholesterol and blood lipids Have your blood tested for lipids and cholesterol at 44 years of age, then have this test every 5 years. Have your cholesterol levels checked more often if:  Your lipid or cholesterol levels are high.  You are older than 44 years of age.  You are at high risk for heart disease. What should I know about cancer screening? Depending on your health history and family history, you may need to have cancer screening at various ages. This may include screening for:  Breast cancer.  Cervical cancer.  Colorectal cancer.  Skin cancer.  Lung cancer. What should I know about heart disease, diabetes, and high blood  pressure? Blood pressure and heart disease  High blood pressure causes heart disease and increases the risk of stroke. This is more likely to develop in people who have high blood pressure readings, are of African descent, or are overweight.  Have your blood pressure checked: ? Every 3-5 years if you are 18-39 years of age. ? Every year if you are 40 years old or older. Diabetes Have regular diabetes screenings. This checks your fasting blood sugar level. Have the screening done:  Once every three years after age 40 if you are at a normal weight and have a low risk for diabetes.  More often and at a younger age if you are overweight or have a high risk for diabetes. What should I know about preventing infection? Hepatitis B If you have a higher risk for hepatitis B, you should be screened for this virus. Talk with your health care provider to find out if you are at risk for hepatitis B infection. Hepatitis C Testing is recommended for:  Everyone born from 1945 through 1965.  Anyone with known risk factors for hepatitis C. Sexually transmitted infections (STIs)  Get screened for STIs, including gonorrhea and chlamydia, if: ? You are sexually active and are younger than 44 years of age. ? You are older than 44 years of age and your health care provider tells you that you are at risk for this type of infection. ? Your sexual activity has changed since you were last screened, and you are at increased risk for chlamydia or gonorrhea. Ask your health care provider   if you are at risk.  Ask your health care provider about whether you are at high risk for HIV. Your health care provider may recommend a prescription medicine to help prevent HIV infection. If you choose to take medicine to prevent HIV, you should first get tested for HIV. You should then be tested every 3 months for as long as you are taking the medicine. Pregnancy  If you are about to stop having your period (premenopausal) and  you may become pregnant, seek counseling before you get pregnant.  Take 400 to 800 micrograms (mcg) of folic acid every day if you become pregnant.  Ask for birth control (contraception) if you want to prevent pregnancy. Osteoporosis and menopause Osteoporosis is a disease in which the bones lose minerals and strength with aging. This can result in bone fractures. If you are 65 years old or older, or if you are at risk for osteoporosis and fractures, ask your health care provider if you should:  Be screened for bone loss.  Take a calcium or vitamin D supplement to lower your risk of fractures.  Be given hormone replacement therapy (HRT) to treat symptoms of menopause. Follow these instructions at home: Lifestyle  Do not use any products that contain nicotine or tobacco, such as cigarettes, e-cigarettes, and chewing tobacco. If you need help quitting, ask your health care provider.  Do not use street drugs.  Do not share needles.  Ask your health care provider for help if you need support or information about quitting drugs. Alcohol use  Do not drink alcohol if: ? Your health care provider tells you not to drink. ? You are pregnant, may be pregnant, or are planning to become pregnant.  If you drink alcohol: ? Limit how much you use to 0-1 drink a day. ? Limit intake if you are breastfeeding.  Be aware of how much alcohol is in your drink. In the [place] one drink equals one 12 oz bottle of beer (355 mL), one 5 oz glass of wine (148 mL), or one 1 oz glass of hard liquor (44 mL). General instructions  Schedule regular health, dental, and eye exams.  Stay current with your vaccines.  Tell your health care provider if: ? You often feel depressed. ? You have ever been abused or do not feel safe at home. Summary  Adopting a healthy lifestyle and getting preventive care are important in promoting health and wellness.  Follow your health care provider's instructions about healthy  diet, exercising, and getting tested or screened for diseases.  Follow your health care provider's instructions on monitoring your cholesterol and blood pressure. This information is not intended to replace advice given to you by your health care provider. Make sure you discuss any questions you have with your health care provider. Document Revised: 05/10/2018 Document Reviewed: 05/10/2018 Elsevier Patient Education  2021 Elsevier Inc.  

## 2020-06-19 NOTE — Progress Notes (Signed)
   Rachel French 04/30/1976 [id number]   History:  45 y.o. G2P0011 presents for annual exam. Amenorrheic/Mirena IUD inserted 10/2016. History of LGSIL with positive HR HPV years ago. PCOS on Metformin. Complains of frustrating facial hair that requires weekly waxing. Normal mammogram history although she is overdue. Mother diagnosed with breast cancer in her mid 53s.   Gynecologic History No LMP recorded. (Menstrual status: IUD).   Contraception/Family planning: IUD  Health Maintenance Last Pap: 05/2019. Results were: normal Last mammogram: 2017. Results were: normal Last colonoscopy: N/A Last DEXA: N/A  Past medical history, past surgical history, family history and social history were all reviewed and documented in the EPIC chart.  ROS:  A ROS was performed and pertinent positives and negatives are included.  Exam:  Vitals:   06/19/20 1137  BP: 132/88  Pulse: (!) 110  Resp: 14  Weight: 192 lb 8 oz (87.3 kg)  Height: 5\' 2"  (1.575 m)   Body mass index is 35.21 kg/m.  General appearance:  Normal Thyroid:  Symmetrical, normal in size, without palpable masses or nodularity. Respiratory  Auscultation:  Clear without wheezing or rhonchi Cardiovascular  Auscultation:  Regular rate, without rubs, murmurs or gallops  Edema/varicosities:  Not grossly evident Abdominal  Soft,nontender, without masses, guarding or rebound.  Liver/spleen:  No organomegaly noted  Hernia:  None appreciated  Skin  Inspection:  Grossly normal   Breasts: Examined lying and sitting.   Right: Without masses, retractions, discharge or axillary adenopathy.   Left: Without masses, retractions, discharge or axillary adenopathy. Gentitourinary   Inguinal/mons:  Normal without inguinal adenopathy  External genitalia:  Normal  BUS/Urethra/Skene's glands:  Normal  Vagina:  Normal  Cervix:  Normal, IUD string visible  Uterus:  Normal in size, shape and contour.  Midline and mobile  Adnexa/parametria:      Rt: Without masses or tenderness.   Lt: Without masses or tenderness.  Anus and perineum: Normal  Digital rectal exam: Normal sphincter tone without palpated masses or tenderness  Assessment/Plan:  45 y.o. G2P0011 for annual exam.   Well female exam with routine gynecological exam - Plan: Comprehensive metabolic panel. Education provided on SBEs, importance of preventative screenings, current guidelines, high calcium diet, regular exercise, and multivitamin daily. Labs with PCP.   Encounter for routine checking of intrauterine contraceptive device (IUD) - Amenorrheic/Mirena IUD inserted 10/2016. IUD strings visible at external cervical os.   PCOS (polycystic ovarian syndrome) -on metformin 500 mg daily  Hirsutism - Plan: spironolactone (ALDACTONE) 50 MG tablet. Will check potassium prior to starting. She can increase to 100 mg if tolerating.   Screening for cervical cancer - LGSIL with positive HPV years ago. Will repeat at 3-year interval per guidelines.  Screening for breast cancer - Normal mammogram history.  Overdue for mammogram.  Mother diagnosed with breast cancer in 2017 in her mid 49s.  Discussed current guidelines and importance of preventative screenings.  She plans to schedule this soon.  Normal breast exam today.  Follow-up in 1 year for annual.     [facility], 11:39 AM 06/19/2020

## 2020-06-20 LAB — COMPREHENSIVE METABOLIC PANEL
AG Ratio: 1.6 (calc) (ref 1.0–2.5)
ALT: 18 U/L (ref 6–29)
AST: 18 U/L (ref 10–30)
Albumin: 4.5 g/dL (ref 3.6–5.1)
Alkaline phosphatase (APISO): 76 U/L (ref 31–125)
BUN: 14 mg/dL (ref 7–25)
CO2: 23 mmol/L (ref 20–32)
Calcium: 9.7 mg/dL (ref 8.6–10.2)
Chloride: 105 mmol/L (ref 98–110)
Creat: 0.72 mg/dL (ref 0.50–1.10)
Globulin: 2.8 g/dL (calc) (ref 1.9–3.7)
Glucose, Bld: 105 mg/dL — ABNORMAL HIGH (ref 65–99)
Potassium: 4.3 mmol/L (ref 3.5–5.3)
Sodium: 137 mmol/L (ref 135–146)
Total Bilirubin: 0.5 mg/dL (ref 0.2–1.2)
Total Protein: 7.3 g/dL (ref 6.1–8.1)

## 2020-07-17 ENCOUNTER — Other Ambulatory Visit: Payer: Self-pay | Admitting: Family Medicine

## 2020-07-17 NOTE — Telephone Encounter (Signed)
Ok to refill??  Last office visit 02/29/2020.  Last refill 06/18/2020.

## 2020-07-18 MED ORDER — ADDERALL XR 20 MG PO CP24: 20.0000 mg | ORAL_CAPSULE | Freq: Every day | ORAL | 0 refills | Status: DC

## 2020-08-08 ENCOUNTER — Other Ambulatory Visit: Payer: Self-pay

## 2020-08-08 ENCOUNTER — Ambulatory Visit (INDEPENDENT_AMBULATORY_CARE_PROVIDER_SITE_OTHER): Payer: 59 | Admitting: Family Medicine

## 2020-08-08 ENCOUNTER — Encounter: Payer: Self-pay | Admitting: Family Medicine

## 2020-08-08 VITALS — BP 128/68 | HR 96 | Temp 98.9°F | Resp 16 | Ht 62.0 in | Wt 189.0 lb

## 2020-08-08 DIAGNOSIS — E282 Polycystic ovarian syndrome: Secondary | ICD-10-CM

## 2020-08-08 DIAGNOSIS — R7303 Prediabetes: Secondary | ICD-10-CM | POA: Diagnosis not present

## 2020-08-08 DIAGNOSIS — E669 Obesity, unspecified: Secondary | ICD-10-CM

## 2020-08-08 DIAGNOSIS — F902 Attention-deficit hyperactivity disorder, combined type: Secondary | ICD-10-CM | POA: Diagnosis not present

## 2020-08-08 DIAGNOSIS — I8391 Asymptomatic varicose veins of right lower extremity: Secondary | ICD-10-CM

## 2020-08-08 DIAGNOSIS — R252 Cramp and spasm: Secondary | ICD-10-CM

## 2020-08-08 MED ORDER — METFORMIN HCL 500 MG PO TABS: 500.0000 mg | ORAL_TABLET | Freq: Every day | ORAL | 3 refills | Status: DC

## 2020-08-08 MED ORDER — ADDERALL XR 20 MG PO CP24: 20.0000 mg | ORAL_CAPSULE | Freq: Every day | ORAL | 0 refills | Status: DC

## 2020-08-08 MED ORDER — VALACYCLOVIR HCL 1 G PO TABS: ORAL_TABLET | ORAL | 1 refills | Status: AC

## 2020-08-08 NOTE — Assessment & Plan Note (Signed)
Continue Adderrall, continues to benefit from medication

## 2020-08-08 NOTE — Progress Notes (Signed)
   Subjective:    Patient ID: Rachel French, female    DOB: 07-26-1975, 45 y.o.   MRN: [id number]  Patient presents for Follow-up (Is not fasting)  Pt here to f/u medications PCOS/ glucose intolerance- last a1c 5.9% she is following a nutrition program called [employer], its low carb She is taking meds Metformin without any SE  ADHD- doing well on Adderall, it keeps her focused, she is caring for her grandson now  She does get leg carmps has varicose veins in right leg, has used compression socks, doesn't want to see specialist at this time, if she drinks more water the cramps go away  Review Of Systems:  GEN- denies fatigue, fever, weight loss,weakness, recent illness HEENT- denies eye drainage, change in vision, nasal discharge, CVS- denies chest pain, palpitations RESP- denies SOB, cough, wheeze ABD- denies N/V, change in stools, abd pain GU- denies dysuria, hematuria, dribbling, incontinence MSK- denies joint pain, muscle aches, injury Neuro- denies headache, dizziness, syncope, seizure activity       Objective:    BP 128/68   Pulse 96   Temp 98.9 F (37.2 C) (Temporal)   Resp 16   Ht 5\' 2"  (1.575 m)   Wt 189 lb (85.7 kg)   SpO2 98%   BMI 34.57 kg/m  GEN- NAD, alert and oriented x3 HEENT- PERRL, EOMI, non injected sclera, pink conjunctiva, MMM, oropharynx clear Neck- Supple, no thyromegaly CVS- RRR, no murmur RESP-CTAB Psych normal affect and mood  EXT- No edema Pulses- Radial, DP- 2+        Assessment & Plan:      Problem List Items Addressed This Visit      Unprioritized   ADD (attention deficit disorder)    Continue Adderrall, continues to benefit from medication       Obesity (BMI 30-39.9)    She has kept off 25lbs since  2020 Continue MTF       Relevant Medications   ADDERALL XR 20 MG 24 hr capsule   metFORMIN (GLUCOPHAGE) 500 MG tablet   Other Relevant Orders   CBC with Differential/Platelet   Comprehensive metabolic panel   PCOS  (polycystic ovarian syndrome)    Continue MTF, she continues to work on healthy eating Followed by GYN       Pre-diabetes - Primary   Relevant Orders   CBC with Differential/Platelet   Comprehensive metabolic panel   Hemoglobin A1c    Other Visit Diagnoses    Varicose veins of right lower extremity, unspecified whether complicated       Compresion hose, check mag level, discussed if pain worsens record evaluation at vascular and vein    Relevant Medications   spironolactone (ALDACTONE) 50 MG tablet   Leg cramps       Relevant Orders   Magnesium      Note: This dictation was prepared with Dragon dictation along with smaller phrase technology. Any transcriptional errors that result from this process are unintentional.

## 2020-08-08 NOTE — Assessment & Plan Note (Signed)
She has kept off 25lbs since  2020 Continue MTF

## 2020-08-08 NOTE — Assessment & Plan Note (Signed)
Continue MTF, she continues to work on healthy eating Followed by [employer]

## 2020-08-08 NOTE — Patient Instructions (Signed)
F/U 4 months Rachel French  °

## 2020-08-09 LAB — CBC WITH DIFFERENTIAL/PLATELET
Absolute Monocytes: 956 cells/uL — ABNORMAL HIGH (ref 200–950)
Basophils Absolute: 71 cells/uL (ref 0–200)
Basophils Relative: 0.9 %
Eosinophils Absolute: 300 cells/uL (ref 15–500)
Eosinophils Relative: 3.8 %
HCT: 42.6 % (ref 35.0–45.0)
Hemoglobin: 13.8 g/dL (ref 11.7–15.5)
Lymphs Abs: 3121 cells/uL (ref 850–3900)
MCH: 31.5 pg (ref 27.0–33.0)
MCHC: 32.4 g/dL (ref 32.0–36.0)
MCV: 97.3 fL (ref 80.0–100.0)
MPV: 11.1 fL (ref 7.5–12.5)
Monocytes Relative: 12.1 %
Neutro Abs: 3452 cells/uL (ref 1500–7800)
Neutrophils Relative %: 43.7 %
Platelets: 371 10*3/uL (ref 140–400)
RBC: 4.38 10*6/uL (ref 3.80–5.10)
RDW: 13.4 % (ref 11.0–15.0)
Total Lymphocyte: 39.5 %
WBC: 7.9 10*3/uL (ref 3.8–10.8)

## 2020-08-09 LAB — MAGNESIUM: Magnesium: 1.8 mg/dL (ref 1.5–2.5)

## 2020-08-09 LAB — COMPREHENSIVE METABOLIC PANEL
AG Ratio: 1.6 (calc) (ref 1.0–2.5)
ALT: 16 U/L (ref 6–29)
AST: 15 U/L (ref 10–30)
Albumin: 4.6 g/dL (ref 3.6–5.1)
Alkaline phosphatase (APISO): 66 U/L (ref 31–125)
BUN: 13 mg/dL (ref 7–25)
CO2: 26 mmol/L (ref 20–32)
Calcium: 9.9 mg/dL (ref 8.6–10.2)
Chloride: 103 mmol/L (ref 98–110)
Creat: 0.71 mg/dL (ref 0.50–1.10)
Globulin: 2.8 g/dL (calc) (ref 1.9–3.7)
Glucose, Bld: 108 mg/dL — ABNORMAL HIGH (ref 65–99)
Potassium: 4.6 mmol/L (ref 3.5–5.3)
Sodium: 137 mmol/L (ref 135–146)
Total Bilirubin: 0.3 mg/dL (ref 0.2–1.2)
Total Protein: 7.4 g/dL (ref 6.1–8.1)

## 2020-08-09 LAB — HEMOGLOBIN A1C
Hgb A1c MFr Bld: 5.9 % of total Hgb — ABNORMAL HIGH (ref <5.7)
Mean Plasma Glucose: 123 mg/dL
eAG (mmol/L): 6.8 mmol/L

## 2020-09-16 ENCOUNTER — Other Ambulatory Visit: Payer: Self-pay | Admitting: Family Medicine

## 2020-09-17 MED ORDER — ADDERALL XR 20 MG PO CP24: 20.0000 mg | ORAL_CAPSULE | Freq: Every day | ORAL | 0 refills | Status: DC

## 2020-09-17 NOTE — Telephone Encounter (Signed)
Ok to refill??  Last office visit/ refill 08/08/2020. 

## 2020-10-19 ENCOUNTER — Other Ambulatory Visit: Payer: Self-pay | Admitting: Family Medicine

## 2020-10-20 NOTE — Telephone Encounter (Signed)
Ok to refill??  Last office visit 08/08/2020.  Last refill 09/17/2020.  Patient has appointment with NP 12/05/2020.

## 2020-10-21 MED ORDER — ADDERALL XR 20 MG PO CP24: 20.0000 mg | ORAL_CAPSULE | Freq: Every day | ORAL | 0 refills | Status: DC

## 2020-10-21 NOTE — Telephone Encounter (Signed)
PDMP reviewed and appropriate.  Refill granted today; will require office visit prior to next refill.

## 2020-10-22 NOTE — Telephone Encounter (Signed)
Appt already rescheduled for 10/29/2020.Thank you!

## 2020-10-23 LAB — HM MAMMOGRAPHY

## 2020-10-29 ENCOUNTER — Other Ambulatory Visit: Payer: Self-pay

## 2020-10-29 ENCOUNTER — Ambulatory Visit (INDEPENDENT_AMBULATORY_CARE_PROVIDER_SITE_OTHER): Payer: 59 | Admitting: Nurse Practitioner

## 2020-10-29 ENCOUNTER — Encounter: Payer: Self-pay | Admitting: Nurse Practitioner

## 2020-10-29 VITALS — BP 124/84 | HR 98 | Temp 98.1°F | Ht 62.0 in | Wt 194.0 lb

## 2020-10-29 DIAGNOSIS — Z79899 Other long term (current) drug therapy: Secondary | ICD-10-CM | POA: Diagnosis not present

## 2020-10-29 DIAGNOSIS — F902 Attention-deficit hyperactivity disorder, combined type: Secondary | ICD-10-CM | POA: Diagnosis not present

## 2020-10-29 MED ORDER — ADDERALL XR 20 MG PO CP24: 20.0000 mg | ORAL_CAPSULE | Freq: Every day | ORAL | 0 refills | Status: DC
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
## 2020-10-29 NOTE — Assessment & Plan Note (Signed)
Chronic.  Stable on Adderall XR 20 mg daily.  Discussed side effects including palpitations, headache, decreased appetite, tics and patient denies all of these.  Discussed risk versus benefit of continuing Adderall.  Patient elects to proceed with continuing Adderall as benefits continue to outweigh risks.  Patient plans to check with insurance regarding possibility of resuming Vyvanse.  PDMP reviewed and appropriate.  Controlled substance agreement signed today.  Refills given for Adderall XR 20 mg 3 months.  Follow-up in 3 months.

## 2020-11-19 ENCOUNTER — Other Ambulatory Visit: Payer: Self-pay | Admitting: *Deleted

## 2020-11-19 DIAGNOSIS — E282 Polycystic ovarian syndrome: Secondary | ICD-10-CM

## 2020-11-19 DIAGNOSIS — L680 Hirsutism: Secondary | ICD-10-CM

## 2020-11-19 MED ORDER — SPIRONOLACTONE 100 MG PO TABS: 100.0000 mg | ORAL_TABLET | Freq: Every day | ORAL | 1 refills | Status: DC

## 2020-11-19 NOTE — Telephone Encounter (Signed)
Med refill request: Spironolactone 100 mg Last AEX: 06/19/20 Next AEX: Not scheduled Last MMG (if hormonal med) N/A Mirena IUD for contraceptive, placed 10/2016  Spoke with patient. Per last AEX w/ Wyline Beady, NP she increased to Spironolactone 100 mg daily and is tolerating medication well, would like to continue. Requesting refill of 90 day supply to pharmacy on file.   Rx pended.  Tiffany - Please Advise?

## 2020-12-05 ENCOUNTER — Ambulatory Visit: Payer: 59 | Admitting: Nurse Practitioner

## 2021-01-29 ENCOUNTER — Ambulatory Visit: Payer: 59 | Admitting: Nurse Practitioner

## 2021-02-06 ENCOUNTER — Ambulatory Visit (INDEPENDENT_AMBULATORY_CARE_PROVIDER_SITE_OTHER): Payer: 59 | Admitting: Nurse Practitioner

## 2021-02-06 ENCOUNTER — Other Ambulatory Visit: Payer: Self-pay

## 2021-02-06 ENCOUNTER — Encounter: Payer: Self-pay | Admitting: Nurse Practitioner

## 2021-02-06 VITALS — BP 118/82 | HR 82 | Temp 98.3°F | Ht 62.0 in | Wt 185.8 lb

## 2021-02-06 DIAGNOSIS — F902 Attention-deficit hyperactivity disorder, combined type: Secondary | ICD-10-CM

## 2021-02-06 MED ORDER — LISDEXAMFETAMINE DIMESYLATE 20 MG PO CAPS: 20.0000 mg | ORAL_CAPSULE | Freq: Every day | ORAL | 0 refills | Status: DC
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
## 2021-02-07 NOTE — Assessment & Plan Note (Signed)
Chronic.  Given poor control of symptoms with Adderall XL 20 mg daily, will try to switch to Vyvanse 20 mg daily pending cost with coupon.  Prescription sent in for Vyvanse 20 mg daily for future date in late September and patient will let me know if this works out.  If it does, I will give 2 more refills to allow for 3 months use.  If she is not able to pick this up due to cost, will continue with Adderall 20 mg XL.  We will follow-up in 3 months.  Controlled substance agreement was signed at last visit.  Due for UDS, however unable to give urine sample today.  We will check at next visit.

## 2021-02-18 ENCOUNTER — Telehealth: Payer: Self-pay | Admitting: *Deleted

## 2021-02-18 ENCOUNTER — Encounter: Payer: Self-pay | Admitting: Nurse Practitioner

## 2021-02-18 DIAGNOSIS — F902 Attention-deficit hyperactivity disorder, combined type: Secondary | ICD-10-CM

## 2021-02-18 NOTE — Telephone Encounter (Signed)
Received request from pharmacy for PA on Vyvanse.   PA submitted.   Dx: F98.8- ADD  Your information has been sent to [facility].

## 2021-02-18 NOTE — Telephone Encounter (Signed)
Received PA determination.   PA approved through 02/18/2022.

## 2021-02-19 MED ORDER — ADDERALL XR 20 MG PO CP24: 20.0000 mg | ORAL_CAPSULE | Freq: Every day | ORAL | 0 refills | Status: DC

## 2021-04-14 ENCOUNTER — Other Ambulatory Visit: Payer: Self-pay

## 2021-04-14 ENCOUNTER — Ambulatory Visit (INDEPENDENT_AMBULATORY_CARE_PROVIDER_SITE_OTHER): Payer: 59 | Admitting: Orthopaedic Surgery

## 2021-04-14 ENCOUNTER — Encounter: Payer: Self-pay | Admitting: Orthopaedic Surgery

## 2021-04-14 ENCOUNTER — Ambulatory Visit: Payer: Self-pay

## 2021-04-14 DIAGNOSIS — G8929 Other chronic pain: Secondary | ICD-10-CM

## 2021-04-14 DIAGNOSIS — M25561 Pain in right knee: Secondary | ICD-10-CM | POA: Diagnosis not present

## 2021-04-14 MED ORDER — BUPIVACAINE HCL 0.5 % IJ SOLN
2.0000 mL | INTRAMUSCULAR | Status: AC | PRN
  Administered 2021-04-14: 2 mL via INTRA_ARTICULAR

## 2021-04-14 MED ORDER — METHYLPREDNISOLONE ACETATE 40 MG/ML IJ SUSP
40.0000 mg | INTRAMUSCULAR | Status: AC | PRN
  Administered 2021-04-14: 40 mg via INTRA_ARTICULAR

## 2021-04-14 MED ORDER — LIDOCAINE HCL 1 % IJ SOLN
2.0000 mL | INTRAMUSCULAR | Status: AC | PRN
  Administered 2021-04-14: 2 mL
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
## 2021-05-07 ENCOUNTER — Encounter: Payer: Self-pay | Admitting: Specialist

## 2021-05-07 ENCOUNTER — Ambulatory Visit (INDEPENDENT_AMBULATORY_CARE_PROVIDER_SITE_OTHER): Payer: 59 | Admitting: Specialist

## 2021-05-07 ENCOUNTER — Ambulatory Visit: Payer: Self-pay

## 2021-05-07 ENCOUNTER — Other Ambulatory Visit: Payer: Self-pay

## 2021-05-07 VITALS — BP 117/79 | HR 93 | Ht 62.0 in | Wt 185.8 lb

## 2021-05-07 DIAGNOSIS — R202 Paresthesia of skin: Secondary | ICD-10-CM

## 2021-05-07 DIAGNOSIS — I872 Venous insufficiency (chronic) (peripheral): Secondary | ICD-10-CM

## 2021-05-07 DIAGNOSIS — M4316 Spondylolisthesis, lumbar region: Secondary | ICD-10-CM

## 2021-05-07 DIAGNOSIS — M5010 Cervical disc disorder with radiculopathy, unspecified cervical region: Secondary | ICD-10-CM

## 2021-05-07 DIAGNOSIS — M4722 Other spondylosis with radiculopathy, cervical region: Secondary | ICD-10-CM | POA: Diagnosis not present

## 2021-05-07 DIAGNOSIS — R200 Anesthesia of skin: Secondary | ICD-10-CM

## 2021-05-07 DIAGNOSIS — R292 Abnormal reflex: Secondary | ICD-10-CM | POA: Diagnosis not present

## 2021-05-07 NOTE — Patient Instructions (Addendum)
Avoid overhead lifting and overhead use of the arms. Do not lift greater than 5 lbs. Adjust head rest in vehicle to prevent hyperextension if rear ended. Take extra precautions to avoid falling, including use of a cane if you feel weak. Avoid frequent bending and stooping  No lifting greater than 10 lbs. May use ice or moist heat for pain. Weight loss is of benefit. Best medication for lumbar disc disease is arthritis medications like motrin, celebrex and naprosyn.Unfortunately you may have irritation of the UC with the NSIADs.Marland Kitchen  Exercise is important to improve your indurance and does allow people to function better inspite of back pain.
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
## 2021-05-08 ENCOUNTER — Encounter: Payer: Self-pay | Admitting: Nurse Practitioner

## 2021-05-08 ENCOUNTER — Ambulatory Visit (INDEPENDENT_AMBULATORY_CARE_PROVIDER_SITE_OTHER): Payer: 59 | Admitting: Nurse Practitioner

## 2021-05-08 DIAGNOSIS — F902 Attention-deficit hyperactivity disorder, combined type: Secondary | ICD-10-CM

## 2021-05-08 MED ORDER — ADDERALL XR 20 MG PO CP24: 20.0000 mg | ORAL_CAPSULE | Freq: Every day | ORAL | 0 refills | Status: DC

## 2021-05-08 NOTE — Assessment & Plan Note (Addendum)
Chronic.  ASRS scale shows adequate control.  Discussed risk versus benefit of continue medication-patient prefers to continue medication for now.  Will continue Adderall XL 20 mg daily.  PDMP reviewed and appropriate-refill sent in.  We will check UDS today.  Controlled substance agreement has been signed in the past.  Follow-up 3 months.
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
## 2021-05-12 LAB — DRUG MONITOR, PANEL 1, W/CONF, URINE
Amphetamine: >15000 ng/mL — ABNORMAL HIGH (ref <250)
Amphetamines: POSITIVE ng/mL — AB (ref <500)
Barbiturates: NEGATIVE ng/mL (ref <300)
Benzodiazepines: NEGATIVE ng/mL (ref <100)
Cocaine Metabolite: NEGATIVE ng/mL (ref <150)
Creatinine: >300 mg/dL (ref >=20.0)
Marijuana Metabolite: NEGATIVE ng/mL (ref <20)
Methadone Metabolite: NEGATIVE ng/mL (ref <100)
Methamphetamine: NEGATIVE ng/mL (ref <250)
Opiates: NEGATIVE ng/mL (ref <100)
Oxidant: NEGATIVE ug/mL (ref <200)
Oxycodone: NEGATIVE ng/mL (ref <100)
Phencyclidine: NEGATIVE ng/mL (ref <25)
pH: 5.6 (ref 4.5–9.0)

## 2021-05-12 LAB — DM TEMPLATE

## 2021-05-23 ENCOUNTER — Encounter: Payer: Self-pay | Admitting: Nurse Practitioner

## 2021-05-27 ENCOUNTER — Ambulatory Visit
Admission: RE | Admit: 2021-05-27 | Discharge: 2021-05-27 | Disposition: A | Discharge status: Home / Self Care | Payer: 59 | Source: Ambulatory Visit | Attending: Specialist | Admitting: Specialist

## 2021-05-27 ENCOUNTER — Other Ambulatory Visit: Payer: Self-pay

## 2021-05-27 DIAGNOSIS — R202 Paresthesia of skin: Secondary | ICD-10-CM

## 2021-05-28 ENCOUNTER — Encounter: Payer: Self-pay | Admitting: Specialist

## 2021-05-28 ENCOUNTER — Ambulatory Visit: Payer: 59 | Admitting: Specialist

## 2021-05-28 VITALS — BP 120/82 | HR 91 | Ht 62.0 in | Wt 180.0 lb

## 2021-05-28 DIAGNOSIS — I872 Venous insufficiency (chronic) (peripheral): Secondary | ICD-10-CM

## 2021-05-28 DIAGNOSIS — M4722 Other spondylosis with radiculopathy, cervical region: Secondary | ICD-10-CM

## 2021-05-28 DIAGNOSIS — M5010 Cervical disc disorder with radiculopathy, unspecified cervical region: Secondary | ICD-10-CM

## 2021-05-28 DIAGNOSIS — M4326 Fusion of spine, lumbar region: Secondary | ICD-10-CM
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
## 2021-05-28 NOTE — Patient Instructions (Signed)
Avoid overhead lifting and overhead use of the arms. Do not lift greater than 5 lbs. Adjust head rest in vehicle to prevent hyperextension if rear ended. Take extra precautions to avoid falling, including use of a cane if you feel weak. Scheduling secretary Tivis Ringer. will call you to arrange for surgery for your cervical spine. If you wish a second opinion please let us know and we can arrange for you. If you have worsening arm or leg numbness or weakness please call or go to an ER. We will contact your cardiologist and primary care physicians to seek clearance for your surgery. Surgery will be an anterior cervical discectomy and fusion at C6-7 and a total disc arthroplasty with removal of the central disc herniation at C5-6 level with decompression of the cervical spinal canal at C6-7 and C5-6 and removal of bone pressing on the spinal cord. An additional decompression at the C6-7 level with bone grafting and plate and screws, Local bone graft as well and allograft bone graft and vivigen.  Risks of surgery include risks of infection, bleeding and risks to the spinal cord and  Risks of sore throat and difficulty swallowing which should  Improve over the next 4-6 weeks following surgery. Surgery is indicated due to upper extremity radiculopathy, lermittes phenomena and falls. In the future surgery at adjacent levels may be necessary but these levels do not appear to be related to your current symptoms or signs.

## 2021-07-09 ENCOUNTER — Ambulatory Visit: Payer: 59 | Admitting: Surgery

## 2021-07-10 ENCOUNTER — Encounter: Payer: Self-pay | Admitting: Specialist

## 2021-07-10 ENCOUNTER — Other Ambulatory Visit: Payer: Self-pay

## 2021-07-10 ENCOUNTER — Ambulatory Visit (INDEPENDENT_AMBULATORY_CARE_PROVIDER_SITE_OTHER): Payer: 59 | Admitting: Specialist

## 2021-07-10 VITALS — BP 125/86 | HR 92 | Ht 62.0 in | Wt 180.0 lb

## 2021-07-10 DIAGNOSIS — R200 Anesthesia of skin: Secondary | ICD-10-CM | POA: Diagnosis not present

## 2021-07-10 DIAGNOSIS — R202 Paresthesia of skin: Secondary | ICD-10-CM

## 2021-07-10 DIAGNOSIS — M5010 Cervical disc disorder with radiculopathy, unspecified cervical region: Secondary | ICD-10-CM
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
## 2021-07-10 NOTE — Patient Instructions (Signed)
Plan: Avoid overhead lifting and overhead use of the arms. Do not lift greater than 5 lbs. Adjust head rest in vehicle to prevent hyperextension if rear ended. Take extra precautions to avoid falling. Will obtain a second opinion and consider disc arthroplasty at both C5-6 and C6-7 for disc herniations.

## 2021-07-22 ENCOUNTER — Encounter: Payer: Self-pay | Admitting: Nurse Practitioner

## 2021-07-22 ENCOUNTER — Other Ambulatory Visit: Payer: Self-pay

## 2021-07-22 ENCOUNTER — Ambulatory Visit (INDEPENDENT_AMBULATORY_CARE_PROVIDER_SITE_OTHER): Payer: 59 | Admitting: Nurse Practitioner

## 2021-07-22 ENCOUNTER — Other Ambulatory Visit (HOSPITAL_COMMUNITY)
Admission: RE | Admit: 2021-07-22 | Discharge: 2021-07-22 | Disposition: A | Discharge status: Home / Self Care | Payer: 59 | Source: Ambulatory Visit | Attending: Nurse Practitioner | Admitting: Nurse Practitioner

## 2021-07-22 VITALS — BP 118/76 | Ht 61.5 in | Wt 181.0 lb

## 2021-07-22 DIAGNOSIS — E282 Polycystic ovarian syndrome: Secondary | ICD-10-CM

## 2021-07-22 DIAGNOSIS — Z01419 Encounter for gynecological examination (general) (routine) without abnormal findings: Secondary | ICD-10-CM | POA: Insufficient documentation

## 2021-07-22 DIAGNOSIS — Z30431 Encounter for routine checking of intrauterine contraceptive device: Secondary | ICD-10-CM | POA: Diagnosis not present

## 2021-07-22 DIAGNOSIS — L680 Hirsutism: Secondary | ICD-10-CM | POA: Diagnosis not present

## 2021-07-22 MED ORDER — METFORMIN HCL 500 MG PO TABS: 500.0000 mg | ORAL_TABLET | Freq: Every day | ORAL | 3 refills | Status: DC

## 2021-07-22 NOTE — Progress Notes (Signed)
° °  Rachel French [birth year] [id number]   History:  46 y.o. G2P0011 presents for annual exam. Amenorrheic/Mirena IUD inserted 10/2016. 1997 LEEP, 2014 LGSIL with positive HR HPV, normal since. PCOS, on Metformin. ADD, bipolar disorder, depression, pre-diabetes managed by PCP. History of IBS, UC, chronic back pain.   Gynecologic History No LMP recorded. (Menstrual status: IUD).   Contraception/Family planning: IUD and vasectomy Sexually active: Yes  Health Maintenance Last Pap: 05/09/2019. Results were: Normal Last mammogram: 10/23/2020. Results were: Normal Last colonoscopy: Never Last DEXA: Not indicated  Past medical history, past surgical history, family history and social history were all reviewed and documented in the EPIC chart. Married. Unemployed, trying to get disability. 61 yo daughter, engaged. 15 mo grandson. Mother diagnosed with breast cancer in her mid 106s.   ROS:  A ROS was performed and pertinent positives and negatives are included.  Exam:  Vitals:   07/22/21 1155  BP: 118/76  Weight: 181 lb (82.1 kg)  Height: 5' 1.5" (1.562 m)    Body mass index is 33.65 kg/m.  General appearance:  Normal Thyroid:  Symmetrical, normal in size, without palpable masses or nodularity. Respiratory  Auscultation:  Clear without wheezing or rhonchi Cardiovascular  Auscultation:  Regular rate, without rubs, murmurs or gallops  Edema/varicosities:  Not grossly evident Abdominal  Soft,nontender, without masses, guarding or rebound.  Liver/spleen:  No organomegaly noted  Hernia:  None appreciated  Skin  Inspection:  Grossly normal   Breasts: Examined lying and sitting.   Right: Without masses, retractions, discharge or axillary adenopathy.   Left: Without masses, retractions, discharge or axillary adenopathy. Genitourinary   Inguinal/mons:  Normal without inguinal adenopathy  External genitalia:  Normal appearing vulva with no masses, tenderness, or  lesions  BUS/Urethra/Skene's glands:  Normal  Vagina:  Normal appearing with normal color and discharge, no lesions  Cervix:  Normal appearing without discharge or lesions  Uterus:  Normal in size, shape and contour.  Midline and mobile, nontender  Adnexa/parametria:     Rt: Normal in size, without masses or tenderness.   Lt: Normal in size, without masses or tenderness.  Anus and perineum: Normal  Digital rectal exam: Normal sphincter tone without palpated masses or tenderness  Patient informed chaperone available to be present for breast and pelvic exam. Patient has requested no chaperone to be present. Patient has been advised what will be completed during breast and pelvic exam.   Assessment/Plan:  46 y.o. G2P0011 for annual exam.   Well female exam with routine gynecological exam - Plan: Cytology - PAP( [place]). Education provided on SBEs, importance of preventative screenings, current guidelines, high calcium diet, regular exercise, and multivitamin daily.  Establishing with new PCP in March and plans to get labs done then.   PCOS (polycystic ovarian syndrome) - Plan: metFORMIN (GLUCOPHAGE) 500 MG tablet daily. Taking as prescribed. Refill provided.   Encounter for routine checking of intrauterine contraceptive device (IUD) - Amenorrheic. Mirena inserted 10/2016. IUD strings visible at external cervical os. She is aware of new 8-year FDA approval.   Screening for cervical cancer - 1997 LEEP, 2014 LGSIL with positive HR HPV, normal since. Pap today.   Screening for breast cancer - Normal mammogram history.  Continue annual screenings. Mother diagnosed with breast cancer in her mid 61s.  Normal breast exam today.  Follow-up in 1 year for annual.     Tamela Gammon [facility], 12:27 PM 07/22/2021

## 2021-07-24 LAB — CYTOLOGY - PAP
Adequacy: ABSENT
Comment: NEGATIVE
Diagnosis: NEGATIVE
High risk HPV: NEGATIVE

## 2021-07-27 ENCOUNTER — Other Ambulatory Visit: Payer: Self-pay | Admitting: Nurse Practitioner

## 2021-07-27 DIAGNOSIS — B3731 Acute candidiasis of vulva and vagina: Secondary | ICD-10-CM

## 2021-07-27 MED ORDER — FLUCONAZOLE 150 MG PO TABS: 150.0000 mg | ORAL_TABLET | ORAL | 0 refills | Status: DC

## 2021-08-07 ENCOUNTER — Ambulatory Visit: Payer: 59 | Admitting: Nurse Practitioner

## 2021-08-14 ENCOUNTER — Ambulatory Visit: Payer: 59 | Admitting: Specialist

## 2021-08-21 ENCOUNTER — Encounter: Payer: Self-pay | Admitting: Orthopaedic Surgery

## 2021-08-21 ENCOUNTER — Other Ambulatory Visit: Payer: Self-pay | Admitting: Physician Assistant

## 2021-08-21 MED ORDER — TRAMADOL HCL 50 MG PO TABS: 50.0000 mg | ORAL_TABLET | Freq: Two times a day (BID) | ORAL | 0 refills | Status: DC | PRN

## 2021-08-21 NOTE — Telephone Encounter (Signed)
I sent in tramadol.  May be a good idea to get visco approval if she is interested

## 2021-08-24 NOTE — Telephone Encounter (Signed)
Treena Leece KeyFlossie Buffy - PA Case ID: [id number] ? ? ? ?PA PENDING.Marland KitchenMarland Kitchen ?

## 2021-08-25 NOTE — Telephone Encounter (Signed)
[name] (KeyFlossie Buffy) - [id number] ?traMADol HCl 50MG  tablets ?    ?Status: PA Response - Denied ? ? ? ?Is there anything else you can prescribe? ? ?

## 2021-08-26 ENCOUNTER — Other Ambulatory Visit: Payer: Self-pay | Admitting: Physician Assistant

## 2021-08-26 MED ORDER — DICLOFENAC SODIUM 75 MG PO TBEC: 75.0000 mg | DELAYED_RELEASE_TABLET | Freq: Two times a day (BID) | ORAL | 3 refills | Status: DC | PRN

## 2021-08-26 NOTE — Telephone Encounter (Signed)
The only other non-narcotic medicine we could try contains codeine which she is allergic to.  I will send in rx strength nsaid to try

## 2021-09-21 ENCOUNTER — Telehealth: Payer: Self-pay

## 2021-09-21 NOTE — Telephone Encounter (Signed)
Send message to pt via MyChart.  ?

## 2022-07-28 ENCOUNTER — Ambulatory Visit (INDEPENDENT_AMBULATORY_CARE_PROVIDER_SITE_OTHER): Payer: Medicaid Other | Admitting: Nurse Practitioner

## 2022-07-28 ENCOUNTER — Encounter: Payer: Self-pay | Admitting: Nurse Practitioner

## 2022-07-28 VITALS — BP 120/80 | Ht 61.5 in | Wt 182.0 lb

## 2022-07-28 DIAGNOSIS — L600 Ingrowing nail: Secondary | ICD-10-CM

## 2022-07-28 DIAGNOSIS — Z30431 Encounter for routine checking of intrauterine contraceptive device: Secondary | ICD-10-CM | POA: Diagnosis not present

## 2022-07-28 DIAGNOSIS — Z01419 Encounter for gynecological examination (general) (routine) without abnormal findings: Secondary | ICD-10-CM | POA: Diagnosis not present

## 2022-07-28 MED ORDER — MUPIROCIN CALCIUM 2 % EX CREA: 1.0000 | TOPICAL_CREAM | Freq: Two times a day (BID) | CUTANEOUS | 0 refills | Status: DC

## 2022-07-28 NOTE — Progress Notes (Signed)
Rachel French [facility], 10:37 AM 07/28/2022  Rachel French 03/23/1976 [id number]   History:  47 y.o. G2P0011 presents for annual exam. Mirena IUD inserted 10/2016, amenorrheic. 1997 LEEP, 2014 LGSIL with positive HR HPV, normal since. PCOS, stopped Metformin. ADD, bipolar disorder, depression, pre-diabetes managed by PCP. History of IBS, UC, chronic back pain. Complains of infected ingrown fingernail x 2 weeks. Has been using neosporin with no improvement.   Gynecologic History No LMP recorded (lmp unknown). (Menstrual status: IUD).   Contraception/Family planning: IUD and vasectomy Sexually active: Yes  Health Maintenance Last Pap: 07/22/2021. Results were: Normal neg HPV, 5-year repeat Last mammogram: 2023. Results were: Normal Last colonoscopy: Never Last DEXA: Not indicated  Past medical history, past surgical history, family history and social history were all reviewed and documented in the EPIC chart. Married. Unemployed, trying to get disability. 54 yo daughter, getting married in August. 2 yo grandson. Mother diagnosed with breast cancer in her mid 39s.   ROS:  A ROS was performed and pertinent positives and negatives are included.  Exam:  Vitals:   07/28/22 1030  BP: 120/80  Weight: 182 lb (82.6 kg)  Height: 5' 1.5" (1.562 m)     Body mass index is 33.83 kg/m.  General appearance:  Normal Thyroid:  Symmetrical, normal in size, without palpable masses or nodularity. Respiratory  Auscultation:  Clear without wheezing or rhonchi Cardiovascular  Auscultation:  Regular rate, without rubs, murmurs or gallops  Edema/varicosities:  Not grossly evident Abdominal  Soft,nontender, without masses, guarding or rebound.  Liver/spleen:  No organomegaly noted  Hernia:  None appreciated  Skin  Inspection:  Grossly normal   Breasts: Examined lying and sitting.   Right: Without masses, retractions, discharge or axillary adenopathy.   Left: Without masses, retractions, discharge  or axillary adenopathy. Genitourinary   Inguinal/mons:  Normal without inguinal adenopathy  External genitalia:  Normal appearing vulva with no masses, tenderness, or lesions  BUS/Urethra/Skene's glands:  Normal  Vagina:  Normal appearing with normal color and discharge, no lesions  Cervix:  Normal appearing without discharge or lesions  Uterus:  Normal in size, shape and contour.  Midline and mobile, nontender  Adnexa/parametria:     Rt: Normal in size, without masses or tenderness.   Lt: Normal in size, without masses or tenderness.  Anus and perineum: Normal  Digital rectal exam: Deferred  Patient informed chaperone available to be present for breast and pelvic exam. Patient has requested no chaperone to be present. Patient has been advised what will be completed during breast and pelvic exam.   Assessment/Plan:  47 y.o. G2P0011 for annual exam.   Well female exam with routine gynecological exam - Education provided on SBEs, importance of preventative screenings, current guidelines, high calcium diet, regular exercise, and multivitamin daily.  Labs with PCP.   PCOS (polycystic ovarian syndrome) - Stopped Metformin and does not want to restart.  Encounter for routine checking of intrauterine contraceptive device (IUD) - Amenorrheic. Mirena inserted 10/2016. IUD strings visible at external cervical os. She is aware of 8-year FDA approval.   Screening for cervical cancer - 1997 LEEP, 2014 LGSIL with positive HR HPV, normal since. Will repeat at 5-year interval per guidelines.    Screening for breast cancer - Normal mammogram history.  Continue annual screenings. Mother diagnosed with breast cancer in her mid 33s.  Normal breast exam today.  Screening for colon cancer - Plans to schedule colonoscopy soon. H/O UC.    Follow-up in 1 year for annual.  Rachel French [facility], 10:37 AM 07/28/2022

## 2022-07-29 ENCOUNTER — Other Ambulatory Visit: Payer: Self-pay | Admitting: Nurse Practitioner

## 2022-07-29 DIAGNOSIS — L600 Ingrowing nail: Secondary | ICD-10-CM

## 2022-07-29 NOTE — Telephone Encounter (Signed)
PA request received. Rachel French is working on it.

## 2022-07-30 NOTE — Telephone Encounter (Signed)
PA sent on cover my meds

## 2022-08-02 NOTE — Telephone Encounter (Signed)
PA for Mupirocin cream denied on 07/29/2022. "At least two preferred drugs must be tried before requesting this drug or tell us why member cannot try any of preferred alternatives."  Alternatives include: gentamicin cream/ointment (generic for Garamycin) and mupirocin ointment.   Also, can advise pt of ~$36.00 OOP cost w/ good rx for cream.   Please advise.

## 2022-08-03 ENCOUNTER — Other Ambulatory Visit: Payer: Self-pay | Admitting: Nurse Practitioner

## 2022-08-03 DIAGNOSIS — L600 Ingrowing nail: Secondary | ICD-10-CM

## 2022-08-03 MED ORDER — MUPIROCIN 2 % EX OINT
1.0000 | TOPICAL_OINTMENT | Freq: Two times a day (BID) | CUTANEOUS | 0 refills | Status: AC
Start: 2022-08-03 — End: 2022-08-13

## 2022-08-03 NOTE — Telephone Encounter (Signed)
Switched to ointment. Thank you.

## 2023-08-25 ENCOUNTER — Ambulatory Visit (INDEPENDENT_AMBULATORY_CARE_PROVIDER_SITE_OTHER): Payer: Medicaid Other | Admitting: Nurse Practitioner

## 2023-08-25 ENCOUNTER — Encounter: Payer: Self-pay | Admitting: Nurse Practitioner

## 2023-08-25 VITALS — BP 132/84 | HR 87 | Ht 62.25 in | Wt 190.0 lb

## 2023-08-25 DIAGNOSIS — Z01419 Encounter for gynecological examination (general) (routine) without abnormal findings: Secondary | ICD-10-CM | POA: Diagnosis not present

## 2023-08-25 DIAGNOSIS — B9689 Other specified bacterial agents as the cause of diseases classified elsewhere: Secondary | ICD-10-CM

## 2023-08-25 DIAGNOSIS — E282 Polycystic ovarian syndrome: Secondary | ICD-10-CM | POA: Diagnosis not present

## 2023-08-25 DIAGNOSIS — Z30431 Encounter for routine checking of intrauterine contraceptive device: Secondary | ICD-10-CM | POA: Diagnosis not present

## 2023-08-25 DIAGNOSIS — N898 Other specified noninflammatory disorders of vagina: Secondary | ICD-10-CM

## 2023-08-25 DIAGNOSIS — E669 Obesity, unspecified: Secondary | ICD-10-CM | POA: Diagnosis not present

## 2023-08-25 DIAGNOSIS — R7303 Prediabetes: Secondary | ICD-10-CM

## 2023-08-25 DIAGNOSIS — N760 Acute vaginitis: Secondary | ICD-10-CM

## 2023-08-25 DIAGNOSIS — Z7689 Persons encountering health services in other specified circumstances: Secondary | ICD-10-CM

## 2023-08-25 LAB — WET PREP FOR TRICH, YEAST, CLUE

## 2023-08-25 MED ORDER — METRONIDAZOLE 500 MG PO TABS: 500.0000 mg | ORAL_TABLET | Freq: Two times a day (BID) | ORAL | 0 refills | Status: DC

## 2023-08-25 MED ORDER — WEGOVY 0.25 MG/0.5ML ~~LOC~~ SOAJ: 0.2500 mg | SUBCUTANEOUS | 0 refills | Status: DC

## 2023-08-25 NOTE — Progress Notes (Signed)
 Rachel French [facility], 1:17 PM 08/25/2023  Wrenn Willcox [birth year] [id number]   History:  48 y.o. G2P0011 presents for annual exam. Mirena IUD inserted 10/2016, amenorrheic. Having night sweats. 1997 LEEP, 2014 LGSIL with positive HR HPV, normal since. PCOS, restarted Metformin in January. ADD, bipolar disorder, depression, pre-diabetes managed by PCP. History of IBS, UC, chronic back pain. Has been trying to lose weight for 6+ months with no success. Was able to lose a 5-10 pounds last fall but none since. Feels she is conscientious of her diet due to prediabetes and UC. Has been trying to exercise but has knee pain due to arthritis, trying to avoid total knee surgery.   Gynecologic History No LMP recorded. (Menstrual status: IUD).   Contraception/Family planning: IUD and vasectomy Sexually active: Yes  Health Maintenance Last Pap: 07/22/2021. Results were: Normal neg HPV Last mammogram: 10/2022. Results were: Normal Last colonoscopy: Never Last DEXA: Not indicated  Past medical history, past surgical history, family history and social history were all reviewed and documented in the EPIC chart. Married. Unemployed due to chronic back pain. 47 yo daughter, married 12/2022, pregnant with second child, has 36 yo son. Mother diagnosed with breast cancer in her mid 4s and again recently.   ROS:  A ROS was performed and pertinent positives and negatives are included.  Exam:  Vitals:   08/25/23 1150  BP: 132/84  Pulse: 87  SpO2: 97%  Weight: 190 lb (86.2 kg)  Height: 5' 2.25" (1.581 m)      Body mass index is 34.47 kg/m.  General appearance:  Normal Thyroid:  Symmetrical, normal in size, without palpable masses or nodularity. Respiratory  Auscultation:  Clear without wheezing or rhonchi Cardiovascular  Auscultation:  Regular rate, without rubs, murmurs or gallops  Edema/varicosities:  Not grossly evident Abdominal  Soft,nontender, without masses, guarding or  rebound.  Liver/spleen:  No organomegaly noted  Hernia:  None appreciated  Skin  Inspection:  Grossly normal   Breasts: Examined lying and sitting.   Right: Without masses, retractions, discharge or axillary adenopathy.   Left: Without masses, retractions, discharge or axillary adenopathy. Pelvic: External genitalia:  no lesions              Urethra:  normal appearing urethra with no masses, tenderness or lesions              Bartholins and Skenes: normal                 Vagina:+ odor, + discharge              Cervix: no lesions. + IUD strings Bimanual Exam:  Uterus:  no masses or tenderness              Adnexa: no mass, fullness, tenderness              Rectovaginal: Deferred              Anus:  normal, no lesions  Patient informed chaperone available to be present for breast and pelvic exam. Patient has requested no chaperone to be present. Patient has been advised what will be completed during breast and pelvic exam.   Wet prep + clue cells (+ odor)  Assessment/Plan:  48 y.o. G2P0011 for annual exam.   Encounter for weight management -  INDICATIONS BMI 27 without co-morbidity  BMI 30 with co-morbidity   WEGOVY HPI Obesity Current BMI 34.47 Has been working on weight loss through diet/behavioral modification program in combination with  exercise including walking, low carb/low sugar diet, calorie deficit for 7 months and has failed to lose 5% or more of body weight Starting date and weight: 11/2022 190lb Current weight 190lb No personal or FH thyroid cancer  WEGOVY PLAN will prescribe Wegovy starting at initial dose of 0.25mg  injection per week for 4 weeks.  will increase dose every 4 weeks until reach target dose of 2.4mg  weekly as maintenance dose discussed SE profile in detail as well as black box warning of thyroid cancer. Discussed si/sx of thyroid cancer including thyromegaly, nodules, hoarse voice, difficulty swallowing. will plan to do a thyroid exam ever 4-12 weeks  at f/u appts  Basic dietary information for weight loss and prevention/management of disease reviewed: -Eliminate sweetened beverages. -Reduce portion/frequency of refined sugars. -Reduce portion sizes overall of starchy and/or high fat foods. -Avoid fried food. -Try to switch to lower fat dairy products. -Consume 2 whole fruits daily. -Consume 4 servings non-starchy vegetables daily. -Choose the least processed food possible (whole fruits instead of fruit juices; whole grain cereals and breads). -No added salt diet. Recommended: Weight loss calories:  weight -500 to equal recommend cal/day. (allows for 1 lb of weight loss per week by diet alone). Referred to : https://www.reyes.com/  Well female exam with routine gynecological exam - Education provided on SBEs, importance of preventative screenings, current guidelines, high calcium diet, regular exercise, and multivitamin daily.  Labs with PCP.   PCOS (polycystic ovarian syndrome) - Plan: Semaglutide-Weight Management (WEGOVY) 0.25 MG/0.5ML SOAJ. Restarted Metformin in January.   Obesity (BMI 30-39.9) - Plan: Semaglutide-Weight Management (WEGOVY) 0.25 MG/0.5ML SOAJ  Pre-diabetes - Plan: Semaglutide-Weight Management (WEGOVY) 0.25 MG/0.5ML SOAJ. Restarted Metformin.   Vaginal odor - Plan: WET PREP FOR TRICH, YEAST, CLUE. + clue cells.   Encounter for routine checking of intrauterine contraceptive device (IUD) - Amenorrheic. Mirena inserted 10/2016. She is aware of 8-year FDA approval.   Bacterial vaginosis - Plan: metroNIDAZOLE (FLAGYL) 500 MG tablet BID x 7 days.   Screening for cervical cancer - 1997 LEEP, 2014 LGSIL with positive HR HPV, normal since. Will repeat at 5-year interval per guidelines.    Screening for breast cancer - Normal mammogram history.  Continue annual screenings. Mother diagnosed with breast cancer in her mid 36s and again recently.  Normal breast exam today.  Screening for colon cancer - Plans to schedule  colonoscopy soon. H/O UC.    Return in about 1 year (around 08/24/2024) for Annual. Follow up in 4 weeks if [facility] approved.      Rachel French [facility], 1:17 PM 08/25/2023

## 2023-08-31 ENCOUNTER — Telehealth: Payer: Self-pay

## 2023-08-31 NOTE — Telephone Encounter (Signed)
 Approval received from [facility] for [facility] 0.25mg /0.87ml

## 2023-08-31 NOTE — Telephone Encounter (Signed)
 Pa for [facility] 0.25mg  sent to plan in cover my meds   Key [id number] Last name Rachel French 1976/03/12  Dx used Obesity BMI 30-39 Waiting response.

## 2023-09-29 ENCOUNTER — Ambulatory Visit (INDEPENDENT_AMBULATORY_CARE_PROVIDER_SITE_OTHER): Admitting: Nurse Practitioner

## 2023-09-29 ENCOUNTER — Encounter: Payer: Self-pay | Admitting: Nurse Practitioner

## 2023-09-29 VITALS — BP 116/84 | HR 93 | Ht 61.75 in | Wt 185.2 lb

## 2023-09-29 DIAGNOSIS — E282 Polycystic ovarian syndrome: Secondary | ICD-10-CM | POA: Diagnosis not present

## 2023-09-29 DIAGNOSIS — R7303 Prediabetes: Secondary | ICD-10-CM | POA: Diagnosis not present

## 2023-09-29 DIAGNOSIS — E669 Obesity, unspecified: Secondary | ICD-10-CM | POA: Diagnosis not present

## 2023-09-29 MED ORDER — WEGOVY 0.5 MG/0.5ML ~~LOC~~ SOAJ: 0.5000 mg | SUBCUTANEOUS | 0 refills | Status: DC

## 2023-09-29 NOTE — Progress Notes (Signed)
   Acute Office Visit  Subjective:    Patient ID: Rachel French, female    DOB: 03-29-1976, 48 y.o.   MRN: [id number]   HPI 48 y.o. presents today for 4-week follow up. Started Wegovy  for weight management, PCOS, prediabetes. Tolerating well.   No LMP recorded. (Menstrual status: IUD).    Review of Systems  Constitutional: Negative.        Objective:    Physical Exam Constitutional:      Appearance: Normal appearance.  Neck:     Thyroid: No thyroid mass or thyromegaly.     BP 116/84 (BP Location: Left Arm, Patient Position: Sitting, Cuff Size: Normal)   Pulse 93   Ht 5' 1.75" (1.568 m)   Wt 185 lb 3.2 oz (84 kg)   SpO2 97%   BMI 34.15 kg/m  Wt Readings from Last 3 Encounters:  09/29/23 185 lb 3.2 oz (84 kg)  08/25/23 190 lb (86.2 kg)  07/28/22 182 lb (82.6 kg)         Assessment & Plan:   Problem List Items Addressed This Visit       Endocrine   PCOS (polycystic ovarian syndrome)   Relevant Medications   Semaglutide -Weight Management (WEGOVY ) 0.5 MG/0.5ML SOAJ     Other   Obesity (BMI 30-39.9) - Primary   Relevant Medications   Semaglutide -Weight Management (WEGOVY ) 0.5 MG/0.5ML SOAJ   Pre-diabetes   Relevant Medications   Semaglutide -Weight Management (WEGOVY ) 0.5 MG/0.5ML SOAJ   INDICATIONS BMI 27 without co-morbidity  BMI 30 with co-morbidity   WEGOVY  HPI Obesity Current BMI 34.47 Has been working on weight loss through diet/behavioral modification program in combination with exercise including walking, low carb/low sugar diet, calorie deficit for 7 months and has failed to lose 5% or more of body weight Starting date and weight: 08/30/23 190lb Current weight 185 lb No personal or FH thyroid cancer  WEGOVY  PLAN Will prescribe Wegovy  starting at initial dose of 0.25mg  injection per week for 4 weeks. Will increase dose every 4 weeks until reach target dose of 2.4mg  weekly as maintenance dose. Discussed SE profile in detail as well as black box  warning of thyroid cancer. Discussed si/sx of thyroid cancer including thyromegaly, nodules, hoarse voice, difficulty swallowing. will plan to do a thyroid exam ever 4-12 weeks at f/u appts  Basic dietary information for weight loss and prevention/management of disease reviewed: -Eliminate sweetened beverages. -Reduce portion/frequency of refined sugars. -Reduce portion sizes overall of starchy and/or high fat foods. -Avoid fried food. -Try to switch to lower fat dairy products. -Consume 2 whole fruits daily. -Consume 4 servings non-starchy vegetables daily. -Choose the least processed food possible (whole fruits instead of fruit juices; whole grain cereals and breads). -No added salt diet. Recommended: Weight loss calories:  weight -500 to equal recommend cal/day. (allows for 1 lb of weight loss per week by diet alone). Referred to : https://www.reyes.com/  Obesity (BMI 30-39.9) - Plan: Semaglutide -Weight Management (WEGOVY ) 0.5 MG/0.5ML SOAJ  PCOS (polycystic ovarian syndrome) - Plan: Semaglutide -Weight Management (WEGOVY ) 0.5 MG/0.5ML SOAJ  Pre-diabetes - Plan: Semaglutide -Weight Management (WEGOVY ) 0.5 MG/0.5ML SOAJ  Return in about 4 weeks (around 10/27/2023) for Med follow up.    Andee Bamberger DNP, 2:20 PM 09/29/2023

## 2023-10-25 ENCOUNTER — Encounter: Payer: Self-pay | Admitting: Nurse Practitioner

## 2023-10-25 ENCOUNTER — Ambulatory Visit (INDEPENDENT_AMBULATORY_CARE_PROVIDER_SITE_OTHER): Admitting: Nurse Practitioner

## 2023-10-25 VITALS — BP 120/84 | HR 97 | Wt 182.0 lb

## 2023-10-25 DIAGNOSIS — R7303 Prediabetes: Secondary | ICD-10-CM

## 2023-10-25 DIAGNOSIS — E669 Obesity, unspecified: Secondary | ICD-10-CM

## 2023-10-25 DIAGNOSIS — E282 Polycystic ovarian syndrome: Secondary | ICD-10-CM

## 2023-10-25 MED ORDER — WEGOVY 1 MG/0.5ML ~~LOC~~ SOAJ: 1.0000 mg | SUBCUTANEOUS | 0 refills | Status: DC

## 2023-10-25 NOTE — Progress Notes (Signed)
   Acute Office Visit  Subjective:    Patient ID: Rachel French, female    DOB: September 19, 1975, 48 y.o.   MRN: [id number]   HPI 48 y.o. presents today for 4-week follow up. Wegovy  for weight management, PCOS, prediabetes. Tolerating well.   No LMP recorded. (Menstrual status: IUD).    Review of Systems  Constitutional: Negative.        Objective:    Physical Exam Constitutional:      Appearance: Normal appearance.  Neck:     Thyroid: No thyroid mass or thyromegaly.     BP 120/84 (BP Location: Right Arm, Patient Position: Sitting)   Pulse 97   Wt 182 lb (82.6 kg)   SpO2 97%   BMI 33.56 kg/m  Wt Readings from Last 3 Encounters:  10/25/23 182 lb (82.6 kg)  09/29/23 185 lb 3.2 oz (84 kg)  08/25/23 190 lb (86.2 kg)         Assessment & Plan:   Problem List Items Addressed This Visit       Endocrine   PCOS (polycystic ovarian syndrome)   Relevant Medications   Semaglutide -Weight Management (WEGOVY ) 1 MG/0.5ML SOAJ     Other   Obesity (BMI 30-39.9) - Primary   Relevant Medications   Semaglutide -Weight Management (WEGOVY ) 1 MG/0.5ML SOAJ   Pre-diabetes   Relevant Medications   Semaglutide -Weight Management (WEGOVY ) 1 MG/0.5ML SOAJ    INDICATIONS BMI 27 without co-morbidity  BMI 30 with co-morbidity   WEGOVY  HPI Obesity Current BMI 33.56 Has been working on weight loss through diet/behavioral modification program in combination with exercise including walking, low carb/low sugar diet, calorie deficit for 7 months and has failed to lose 5% or more of body weight Starting date and weight: 08/30/23 190lb Current weight 182 lb No personal or FH thyroid cancer  WEGOVY  PLAN Will prescribe Wegovy  starting at initial dose of 0.25mg  injection per week for 4 weeks. Will increase dose every 4 weeks until reach target dose of 2.4mg  weekly as maintenance dose. Discussed SE profile in detail as well as black box warning of thyroid cancer. Discussed si/sx of thyroid cancer  including thyromegaly, nodules, hoarse voice, difficulty swallowing. will plan to do a thyroid exam ever 4-12 weeks at f/u appts  Basic dietary information for weight loss and prevention/management of disease reviewed: -Eliminate sweetened beverages. -Reduce portion/frequency of refined sugars. -Reduce portion sizes overall of starchy and/or high fat foods. -Avoid fried food. -Try to switch to lower fat dairy products. -Consume 2 whole fruits daily. -Consume 4 servings non-starchy vegetables daily. -Choose the least processed food possible (whole fruits instead of fruit juices; whole grain cereals and breads). -No added salt diet. Recommended: Weight loss calories:  weight -500 to equal recommend cal/day. (allows for 1 lb of weight loss per week by diet alone). Referred to : https://www.reyes.com/  Obesity (BMI 30-39.9) - Plan: Semaglutide -Weight Management (WEGOVY ) 0.5 MG/0.5ML SOAJ  PCOS (polycystic ovarian syndrome) - Plan: Semaglutide -Weight Management (WEGOVY ) 0.5 MG/0.5ML SOAJ  Pre-diabetes - Plan: Semaglutide -Weight Management (WEGOVY ) 0.5 MG/0.5ML SOAJ  Return in about 4 weeks (around 11/22/2023) for Med follow up (Wegovy ).    Andee Bamberger DNP, 2:33 PM 10/25/2023

## 2023-11-17 ENCOUNTER — Ambulatory Visit (INDEPENDENT_AMBULATORY_CARE_PROVIDER_SITE_OTHER): Admitting: Nurse Practitioner

## 2023-11-17 ENCOUNTER — Encounter: Payer: Self-pay | Admitting: Nurse Practitioner

## 2023-11-17 VITALS — BP 100/64 | HR 99 | Wt 176.0 lb

## 2023-11-17 DIAGNOSIS — E669 Obesity, unspecified: Secondary | ICD-10-CM

## 2023-11-17 DIAGNOSIS — R7303 Prediabetes: Secondary | ICD-10-CM | POA: Diagnosis not present

## 2023-11-17 DIAGNOSIS — E282 Polycystic ovarian syndrome: Secondary | ICD-10-CM

## 2023-11-17 MED ORDER — WEGOVY 1.7 MG/0.75ML ~~LOC~~ SOAJ: 1.7000 mg | SUBCUTANEOUS | 0 refills | Status: DC

## 2023-11-17 NOTE — Progress Notes (Signed)
   Acute Office Visit  Subjective:    Patient ID: Rachel French, female    DOB: 1975-11-24, 48 y.o.   MRN: [id number]   HPI 48 y.o. presents today for 4-week follow up. Wegovy  for weight management, PCOS, prediabetes. Tolerating well.   No LMP recorded. (Menstrual status: IUD).    Review of Systems  Constitutional: Negative.        Objective:    Physical Exam Constitutional:      Appearance: Normal appearance.  Neck:     Thyroid: No thyroid mass or thyromegaly.     BP 100/64   Pulse 99   Wt 176 lb (79.8 kg)   SpO2 97%   BMI 32.45 kg/m  Wt Readings from Last 3 Encounters:  11/17/23 176 lb (79.8 kg)  10/25/23 182 lb (82.6 kg)  09/29/23 185 lb 3.2 oz (84 kg)         Assessment & Plan:   Problem List Items Addressed This Visit       Endocrine   PCOS (polycystic ovarian syndrome)   Relevant Medications   Semaglutide -Weight Management (WEGOVY ) 1.7 MG/0.75ML SOAJ     Other   Obesity (BMI 30-39.9) - Primary   Relevant Medications   Semaglutide -Weight Management (WEGOVY ) 1.7 MG/0.75ML SOAJ   Other Visit Diagnoses       Prediabetes       Relevant Medications   Semaglutide -Weight Management (WEGOVY ) 1.7 MG/0.75ML SOAJ        INDICATIONS BMI 27 without co-morbidity  BMI 30 with co-morbidity   WEGOVY  HPI Obesity Current BMI 32.45 Has been working on weight loss through diet/behavioral modification program in combination with exercise including walking, low carb/low sugar diet, calorie deficit for 7 months and has failed to lose 5% or more of body weight Starting date and weight: 08/30/23 190lb Current weight 176 lb No personal or FH thyroid cancer  WEGOVY  PLAN Will prescribe Wegovy  starting at initial dose of 0.25mg  injection per week for 4 weeks. Will increase dose every 4 weeks until reach target dose of 2.4mg  weekly as maintenance dose. Discussed SE profile in detail as well as black box warning of thyroid cancer. Discussed si/sx of thyroid cancer  including thyromegaly, nodules, hoarse voice, difficulty swallowing. will plan to do a thyroid exam ever 4-12 weeks at f/u appts  Basic dietary information for weight loss and prevention/management of disease reviewed: -Eliminate sweetened beverages. -Reduce portion/frequency of refined sugars. -Reduce portion sizes overall of starchy and/or high fat foods. -Avoid fried food. -Try to switch to lower fat dairy products. -Consume 2 whole fruits daily. -Consume 4 servings non-starchy vegetables daily. -Choose the least processed food possible (whole fruits instead of fruit juices; whole grain cereals and breads). -No added salt diet. Recommended: Weight loss calories:  weight -500 to equal recommend cal/day. (allows for 1 lb of weight loss per week by diet alone). Referred to : https://www.reyes.com/  Obesity (BMI 30-39.9) - Plan: Semaglutide -Weight Management (WEGOVY ) 1.7 MG/0.75ML SOAJ  PCOS (polycystic ovarian syndrome) - Plan: Semaglutide -Weight Management (WEGOVY ) 1.7 MG/0.75ML SOAJ  Prediabetes - Plan: Semaglutide -Weight Management (WEGOVY ) 1.7 MG/0.75ML SOAJ  Return in about 4 weeks (around 12/15/2023) for Med follow up (Wegovy ).    Andee Bamberger DNP, 2:25 PM 11/17/2023

## 2023-11-28 ENCOUNTER — Ambulatory Visit: Admitting: Nurse Practitioner

## 2023-12-20 ENCOUNTER — Ambulatory Visit (INDEPENDENT_AMBULATORY_CARE_PROVIDER_SITE_OTHER): Admitting: Nurse Practitioner

## 2023-12-20 ENCOUNTER — Encounter: Payer: Self-pay | Admitting: Nurse Practitioner

## 2023-12-20 VITALS — BP 130/84 | HR 92 | Ht 61.25 in | Wt 173.0 lb

## 2023-12-20 DIAGNOSIS — E669 Obesity, unspecified: Secondary | ICD-10-CM

## 2023-12-20 DIAGNOSIS — E282 Polycystic ovarian syndrome: Secondary | ICD-10-CM

## 2023-12-20 DIAGNOSIS — R7303 Prediabetes: Secondary | ICD-10-CM

## 2023-12-20 MED ORDER — WEGOVY 2.4 MG/0.75ML ~~LOC~~ SOAJ: 2.4000 mg | SUBCUTANEOUS | 0 refills | Status: DC

## 2023-12-20 NOTE — Progress Notes (Signed)
   Acute Office Visit  Subjective:    Patient ID: Rachel French, female    DOB: 1975/09/04, 48 y.o.   MRN: [id number]   HPI 48 y.o. presents today for 4-week follow up. Wegovy  for weight management, PCOS, prediabetes. Tolerating well.   No LMP recorded. (Menstrual status: IUD).    Review of Systems  Constitutional: Negative.        Objective:    Physical Exam Constitutional:      Appearance: Normal appearance.  Neck:     Thyroid: No thyroid mass or thyromegaly.     BP 130/84 (BP Location: Right Arm, Patient Position: Sitting)   Pulse 92   Ht 5' 1.25 (1.556 m)   Wt 173 lb (78.5 kg)   SpO2 98%   BMI 32.42 kg/m  Wt Readings from Last 3 Encounters:  12/20/23 173 lb (78.5 kg)  11/17/23 176 lb (79.8 kg)  10/25/23 182 lb (82.6 kg)         Assessment & Plan:   Problem List Items Addressed This Visit       Endocrine   PCOS (polycystic ovarian syndrome)   Relevant Medications   Semaglutide -Weight Management (WEGOVY ) 2.4 MG/0.75ML SOAJ     Other   Obesity (BMI 30-39.9) - Primary   Relevant Medications   Semaglutide -Weight Management (WEGOVY ) 2.4 MG/0.75ML SOAJ   Pre-diabetes   Relevant Medications   Semaglutide -Weight Management (WEGOVY ) 2.4 MG/0.75ML SOAJ   Other Relevant Orders   Hemoglobin A1c      INDICATIONS BMI 27 without co-morbidity  BMI 30 with co-morbidity   WEGOVY  HPI Obesity Current BMI 32.42 Has been working on weight loss through diet/behavioral modification program in combination with exercise including walking, low carb/low sugar diet, calorie deficit for 7 months and has failed to lose 5% or more of body weight Starting date and weight: 08/30/23 190lb Current weight 173 lb No personal or FH thyroid cancer  WEGOVY  PLAN Will prescribe Wegovy  starting at initial dose of 0.25mg  injection per week for 4 weeks. Will increase dose every 4 weeks until reach target dose of 2.4mg  weekly as maintenance dose. Discussed SE profile in detail as  well as black box warning of thyroid cancer. Discussed si/sx of thyroid cancer including thyromegaly, nodules, hoarse voice, difficulty swallowing. will plan to do a thyroid exam ever 4-12 weeks at f/u appts  Basic dietary information for weight loss and prevention/management of disease reviewed: -Eliminate sweetened beverages. -Reduce portion/frequency of refined sugars. -Reduce portion sizes overall of starchy and/or high fat foods. -Avoid fried food. -Try to switch to lower fat dairy products. -Consume 2 whole fruits daily. -Consume 4 servings non-starchy vegetables daily. -Choose the least processed food possible (whole fruits instead of fruit juices; whole grain cereals and breads). -No added salt diet. Recommended: Weight loss calories:  weight -500 to equal recommend cal/day. (allows for 1 lb of weight loss per week by diet alone). Referred to : https://www.reyes.com/  Plan: Increase to 2.4 mg weekly. Tolerating well. Recommend increasing protein intake, increasing exercise. A1c today.   Return in about 4 weeks (around 01/17/2024) for Med follow up.    Annabella DELENA Shutter DNP, 9:58 AM 12/20/2023

## 2023-12-21 ENCOUNTER — Ambulatory Visit: Payer: Self-pay | Admitting: Nurse Practitioner

## 2023-12-21 LAB — HEMOGLOBIN A1C
Hgb A1c MFr Bld: 5.8 % — ABNORMAL HIGH (ref <5.7)
Mean Plasma Glucose: 120 mg/dL
eAG (mmol/L): 6.6 mmol/L

## 2024-01-17 ENCOUNTER — Encounter: Payer: Self-pay | Admitting: Nurse Practitioner

## 2024-01-17 ENCOUNTER — Ambulatory Visit: Admitting: Nurse Practitioner

## 2024-01-17 VITALS — BP 104/70 | HR 78 | Ht 61.0 in | Wt 167.0 lb

## 2024-01-17 DIAGNOSIS — E669 Obesity, unspecified: Secondary | ICD-10-CM

## 2024-01-17 DIAGNOSIS — R7303 Prediabetes: Secondary | ICD-10-CM | POA: Diagnosis not present

## 2024-01-17 DIAGNOSIS — E282 Polycystic ovarian syndrome: Secondary | ICD-10-CM | POA: Diagnosis not present

## 2024-01-17 MED ORDER — SEMAGLUTIDE-WEIGHT MANAGEMENT 2.4 MG/0.75ML ~~LOC~~ SOAJ
2.4000 mg | SUBCUTANEOUS | 6 refills | Status: DC
Start: 2024-01-17 — End: 2024-03-19

## 2024-01-17 NOTE — Progress Notes (Signed)
   Acute Office Visit  Subjective:    Patient ID: Rachel French, female    DOB: 1976-04-28, 48 y.o.   MRN: [id number]   HPI 48 y.o. presents today for 4-week follow up. Wegovy  for weight management, PCOS, prediabetes. Tolerating well.   No LMP recorded. (Menstrual status: IUD).    Review of Systems  Constitutional: Negative.        Objective:    Physical Exam Constitutional:      Appearance: Normal appearance.  Neck:     Thyroid: No thyroid mass or thyromegaly.     BP 104/70 (BP Location: Right Arm, Patient Position: Sitting, Cuff Size: Normal)   Pulse 78   Ht 5' 1 (1.549 m)   Wt 167 lb (75.8 kg)   SpO2 99%   BMI 31.55 kg/m  Wt Readings from Last 3 Encounters:  01/17/24 167 lb (75.8 kg)  12/20/23 173 lb (78.5 kg)  11/17/23 176 lb (79.8 kg)         Assessment & Plan:   Problem List Items Addressed This Visit       Endocrine   PCOS (polycystic ovarian syndrome)   Relevant Medications   semaglutide -weight management (WEGOVY ) 2.4 MG/0.75ML SOAJ SQ injection     Other   Obesity (BMI 30-39.9) - Primary   Relevant Medications   semaglutide -weight management (WEGOVY ) 2.4 MG/0.75ML SOAJ SQ injection   Other Visit Diagnoses       Prediabetes       Relevant Medications   semaglutide -weight management (WEGOVY ) 2.4 MG/0.75ML SOAJ SQ injection       INDICATIONS BMI 27 without co-morbidity  BMI 30 with co-morbidity   WEGOVY  HPI Obesity Current BMI 31.55 Has been working on weight loss through diet/behavioral modification program in combination with exercise including walking, low carb/low sugar diet, calorie deficit for 7 months and has failed to lose 5% or more of body weight Starting date and weight: 08/30/23 190lb Current weight 167 lb No personal or FH thyroid cancer  WEGOVY  PLAN Will prescribe Wegovy  starting at initial dose of 0.25mg  injection per week for 4 weeks. Will increase dose every 4 weeks until reach target dose of 2.4mg  weekly as  maintenance dose. Discussed SE profile in detail as well as black box warning of thyroid cancer. Discussed si/sx of thyroid cancer including thyromegaly, nodules, hoarse voice, difficulty swallowing. will plan to do a thyroid exam ever 4-12 weeks at f/u appts  Basic dietary information for weight loss and prevention/management of disease reviewed: -Eliminate sweetened beverages. -Reduce portion/frequency of refined sugars. -Reduce portion sizes overall of starchy and/or high fat foods. -Avoid fried food. -Try to switch to lower fat dairy products. -Consume 2 whole fruits daily. -Consume 4 servings non-starchy vegetables daily. -Choose the least processed food possible (whole fruits instead of fruit juices; whole grain cereals and breads). -No added salt diet. Recommended: Weight loss calories:  weight -500 to equal recommend cal/day. (allows for 1 lb of weight loss per week by diet alone). Referred to : https://www.reyes.com/  Plan: Continue maintenance dose of 2.4 mg weekly. Tolerating well. Recommend increasing protein intake, increasing exercise.    Annabella DELENA Shutter DNP, 10:33 AM 01/17/2024

## 2024-03-14 ENCOUNTER — Encounter: Payer: Self-pay | Admitting: Nurse Practitioner

## 2024-03-14 DIAGNOSIS — R7303 Prediabetes: Secondary | ICD-10-CM

## 2024-03-14 DIAGNOSIS — E282 Polycystic ovarian syndrome: Secondary | ICD-10-CM

## 2024-03-14 DIAGNOSIS — E669 Obesity, unspecified: Secondary | ICD-10-CM

## 2024-03-14 NOTE — Telephone Encounter (Signed)
 Prior authorization was denied. Routing to [place], NP

## 2024-03-14 NOTE — Telephone Encounter (Signed)
 Routing to [employer] for PA

## 2024-03-14 NOTE — Telephone Encounter (Signed)
 PA submitted. Patient is aware it may take several days for a response.  KEY: [id number]

## 2024-03-19 MED ORDER — SEMAGLUTIDE-WEIGHT MANAGEMENT 2.4 MG/0.75ML ~~LOC~~ SOAJ
2.4000 mg | SUBCUTANEOUS | 6 refills | Status: AC
Start: 2024-03-19 — End: ?

## 2024-03-19 NOTE — Addendum Note (Signed)
 Addended by: Shamica Moree N on: 03/19/2024 10:00 AM   Modules accepted: Orders

## 2024-03-19 NOTE — Telephone Encounter (Signed)
 Spoke with patient. Advised if no change in insurance, PA would not be beneficial to submit. Patient reports no change in insurance.   Advised I will contact pharmacy to confirm if Wegovy  Rx was discontinued. Advised if new Rx needed will send to [facility]. Patient aware [facility] is out of office, response may not be immediate.   Spoke with Dorothyann at [facility], was advised RX on file for [place], received on 01/17/24 with 5 refills. Was advised for patient to be able to use coupon will need updated Rx with ICD of obesity, currently has pre-diabetes.    Tiffany -Rx updated with Obesity as primary ICD 10, ok to resend?

## 2024-03-19 NOTE — Telephone Encounter (Signed)
 Patient notified. Encounter closed

## 2024-03-19 NOTE — Telephone Encounter (Signed)
Yes, OK to send.  Thanks.
